# Patient Record
Sex: Female | Born: 1970 | Race: White | Hispanic: No | Smoking: Current every day smoker
Health system: Southern US, Community
[De-identification: ages and names within clinical notes are randomized; demographics above are authoritative.]

## PROBLEM LIST (undated history)

## (undated) DIAGNOSIS — F4310 Post-traumatic stress disorder, unspecified: Secondary | ICD-10-CM

## (undated) DIAGNOSIS — F419 Anxiety disorder, unspecified: Secondary | ICD-10-CM

## (undated) DIAGNOSIS — F259 Schizoaffective disorder, unspecified: Secondary | ICD-10-CM

## (undated) HISTORY — PX: ECTOPIC PREGNANCY SURGERY: SHX613

---

## 1999-06-04 ENCOUNTER — Emergency Department (HOSPITAL_COMMUNITY): Admission: EM | Admit: 1999-06-04 | Discharge: 1999-06-04 | Payer: Self-pay | Admitting: Emergency Medicine

## 2002-01-05 ENCOUNTER — Emergency Department (HOSPITAL_COMMUNITY): Admission: EM | Admit: 2002-01-05 | Discharge: 2002-01-06 | Payer: Self-pay | Admitting: Emergency Medicine

## 2002-01-05 ENCOUNTER — Encounter: Payer: Self-pay | Admitting: Emergency Medicine

## 2002-01-13 ENCOUNTER — Encounter: Payer: Self-pay | Admitting: *Deleted

## 2002-01-13 ENCOUNTER — Emergency Department (HOSPITAL_COMMUNITY): Admission: EM | Admit: 2002-01-13 | Discharge: 2002-01-13 | Payer: Self-pay | Admitting: *Deleted

## 2002-02-16 ENCOUNTER — Emergency Department (HOSPITAL_COMMUNITY): Admission: EM | Admit: 2002-02-16 | Discharge: 2002-02-16 | Payer: Self-pay | Admitting: Emergency Medicine

## 2002-02-16 ENCOUNTER — Encounter: Payer: Self-pay | Admitting: Emergency Medicine

## 2002-02-20 ENCOUNTER — Inpatient Hospital Stay (HOSPITAL_COMMUNITY): Admission: AD | Admit: 2002-02-20 | Discharge: 2002-02-20 | Payer: Self-pay | Admitting: Obstetrics and Gynecology

## 2002-02-20 ENCOUNTER — Encounter: Payer: Self-pay | Admitting: Obstetrics and Gynecology

## 2002-03-06 ENCOUNTER — Encounter: Payer: Self-pay | Admitting: Obstetrics and Gynecology

## 2002-03-06 ENCOUNTER — Inpatient Hospital Stay (HOSPITAL_COMMUNITY): Admission: RE | Admit: 2002-03-06 | Discharge: 2002-03-06 | Payer: Self-pay | Admitting: Obstetrics and Gynecology

## 2002-03-09 ENCOUNTER — Inpatient Hospital Stay (HOSPITAL_COMMUNITY): Admission: AD | Admit: 2002-03-09 | Discharge: 2002-03-09 | Payer: Self-pay | Admitting: Obstetrics and Gynecology

## 2002-04-03 ENCOUNTER — Ambulatory Visit (HOSPITAL_COMMUNITY): Admission: AD | Admit: 2002-04-03 | Discharge: 2002-04-03 | Payer: Self-pay | Admitting: Obstetrics and Gynecology

## 2002-04-03 ENCOUNTER — Encounter: Payer: Self-pay | Admitting: Obstetrics and Gynecology

## 2012-06-26 ENCOUNTER — Emergency Department (HOSPITAL_COMMUNITY): Payer: Self-pay

## 2012-06-26 ENCOUNTER — Emergency Department (HOSPITAL_COMMUNITY)
Admission: EM | Admit: 2012-06-26 | Discharge: 2012-06-26 | Disposition: A | Discharge status: Home / Self Care | Payer: Self-pay | Attending: Emergency Medicine | Admitting: Emergency Medicine

## 2012-06-26 ENCOUNTER — Encounter (HOSPITAL_COMMUNITY): Payer: Self-pay | Admitting: *Deleted

## 2012-06-26 DIAGNOSIS — R4182 Altered mental status, unspecified: Secondary | ICD-10-CM | POA: Insufficient documentation

## 2012-06-26 DIAGNOSIS — Z79899 Other long term (current) drug therapy: Secondary | ICD-10-CM | POA: Insufficient documentation

## 2012-06-26 DIAGNOSIS — R739 Hyperglycemia, unspecified: Secondary | ICD-10-CM

## 2012-06-26 DIAGNOSIS — T401X4A Poisoning by heroin, undetermined, initial encounter: Secondary | ICD-10-CM | POA: Insufficient documentation

## 2012-06-26 DIAGNOSIS — Z3202 Encounter for pregnancy test, result negative: Secondary | ICD-10-CM | POA: Insufficient documentation

## 2012-06-26 DIAGNOSIS — IMO0001 Reserved for inherently not codable concepts without codable children: Secondary | ICD-10-CM | POA: Insufficient documentation

## 2012-06-26 DIAGNOSIS — F17200 Nicotine dependence, unspecified, uncomplicated: Secondary | ICD-10-CM | POA: Insufficient documentation

## 2012-06-26 DIAGNOSIS — R7309 Other abnormal glucose: Secondary | ICD-10-CM | POA: Insufficient documentation

## 2012-06-26 LAB — CBC WITH DIFFERENTIAL/PLATELET
Blasts: 0 %
Eosinophils Absolute: 0 10*3/uL (ref 0.0–0.7)
MCH: 30.2 pg (ref 26.0–34.0)
MCHC: 32.5 g/dL (ref 30.0–36.0)
MCV: 92.8 fL (ref 78.0–100.0)
Metamyelocytes Relative: 0 %
Myelocytes: 0 %
Neutro Abs: 10.3 10*3/uL — ABNORMAL HIGH (ref 1.7–7.7)
Neutrophils Relative %: 59 % (ref 43–77)
Platelets: 397 10*3/uL (ref 150–400)
Promyelocytes Absolute: 0 %
RDW: 13.9 % (ref 11.5–15.5)
nRBC: 0 /100 WBC

## 2012-06-26 LAB — COMPREHENSIVE METABOLIC PANEL
ALT: 32 U/L (ref 0–35)
AST: 40 U/L — ABNORMAL HIGH (ref 0–37)
Alkaline Phosphatase: 56 U/L (ref 39–117)
CO2: 22 mEq/L (ref 19–32)
Calcium: 9.2 mg/dL (ref 8.4–10.5)
GFR calc non Af Amer: >90 mL/min (ref >90)
Glucose, Bld: 217 mg/dL — ABNORMAL HIGH (ref 70–99)
Potassium: 4 mEq/L (ref 3.5–5.1)
Sodium: 138 mEq/L (ref 135–145)

## 2012-06-26 LAB — GLUCOSE, CAPILLARY: Glucose-Capillary: 190 mg/dL — ABNORMAL HIGH (ref 70–99)

## 2012-06-26 LAB — ACETAMINOPHEN LEVEL: Acetaminophen (Tylenol), Serum: <15 ug/mL (ref 10–30)

## 2012-06-26 LAB — SALICYLATE LEVEL: Salicylate Lvl: <2 mg/dL — ABNORMAL LOW (ref 2.8–20.0)

## 2012-06-26 MED ORDER — SODIUM CHLORIDE 0.9 % IV BOLUS (SEPSIS)
1000.0000 mL | Freq: Once | INTRAVENOUS | Status: AC
  Administered 2012-06-26: 1000 mL via INTRAVENOUS

## 2012-06-26 MED ORDER — NALOXONE HCL 1 MG/ML IJ SOLN
2.0000 mg | Freq: Once | INTRAMUSCULAR | Status: AC
  Administered 2012-06-26: 2 mg via INTRAVENOUS

## 2012-06-26 NOTE — ED Notes (Signed)
Transported to xray 

## 2012-06-26 NOTE — ED Notes (Signed)
Pt awake and responding to verbal commands after Narcan; Nasal Trumpet removed per pt; pt states that she injected Heroin but it was white and it isn't usually white; pt denies SI or HI; states she just wanted to get high; pt denies desire to quit using heroin; pt also states that she has been using crack cocaine earlier today.

## 2012-06-26 NOTE — ED Notes (Signed)
Per friend pt went into friends house; was inside house about five mins came back to car and went completely unresponsive; unresponsive on arrival

## 2012-06-26 NOTE — ED Provider Notes (Signed)
History     CSN: [id number]  Arrival date & time 06/26/12  1947   First MD Initiated Contact with Patient 06/26/12 1955      Chief Complaint  Patient presents with  . Drug Overdose    (Consider location/radiation/quality/duration/timing/severity/associated sxs/prior treatment) HPI Comments: Dropped off in triage unresponsive with agonal respiration. Friend endorses probable drug use. No evidence of trauma.  The history is provided by a friend. The history is limited by the condition of the patient.    History reviewed. No pertinent past medical history.  History reviewed. No pertinent past surgical history.  No family history on file.  History  Substance Use Topics  . Smoking status: Current Every Day Smoker -- 1.00 packs/day    Types: Cigarettes  . Smokeless tobacco: Not on file  . Alcohol Use: Yes     Comment: socially    OB History   Grav Para Term Preterm Abortions TAB SAB Ect Mult Living                  Review of Systems  Unable to perform ROS: Acuity of condition    Allergies  Codeine and Penicillins  Home Medications   Current Outpatient Rx  Name  Route  Sig  Dispense  Refill  . Aspirin-Acetaminophen-Caffeine (GOODY HEADACHE PO)   Oral   Take 1 Package by mouth as needed (pain).           BP 136/94  Pulse 109  Temp(Src) 98.4 F (36.9 C) (Oral)  Resp 22  SpO2 100%  LMP 05/29/2012  Physical Exam  Constitutional: She appears well-developed and well-nourished. She appears distressed.  Unresponsive, shallow agonal respirations  HENT:  Head: Normocephalic and atraumatic.  Mouth/Throat: Oropharynx is clear and moist. No oropharyngeal exudate.  Eyes: Conjunctivae and EOM are normal. Pupils are equal, round, and reactive to light.  Pinpoint pupils bilaterally  Neck: Normal range of motion. Neck supple.  Cardiovascular: Regular rhythm and normal heart sounds.   No murmur heard. Tachycardic  Pulmonary/Chest: Breath sounds normal. No  respiratory distress.  Shallow agonal respirations  Abdominal: Soft. There is no tenderness. There is no rebound and no guarding.  Musculoskeletal: Normal range of motion. She exhibits no edema and no tenderness.  Neurological:  Unresponsive.  Skin: Skin is warm and dry.    ED Course  Procedures (including critical care time)  Labs Reviewed  GLUCOSE, CAPILLARY - Abnormal; Notable for the following:    Glucose-Capillary 190 (*)    All other components within normal limits  CBC WITH DIFFERENTIAL - Abnormal; Notable for the following:    WBC 17.4 (*)    Neutro Abs 10.3 (*)    Lymphs Abs 5.9 (*)    Monocytes Absolute 1.2 (*)    All other components within normal limits  COMPREHENSIVE METABOLIC PANEL - Abnormal; Notable for the following:    Glucose, Bld 217 (*)    AST 40 (*)    Total Bilirubin 0.2 (*)    All other components within normal limits  SALICYLATE LEVEL - Abnormal; Notable for the following:    Salicylate Lvl <2.0 (*)    All other components within normal limits  TROPONIN I  ETHANOL  ACETAMINOPHEN LEVEL  D-DIMER, QUANTITATIVE  PREGNANCY, URINE  URINE RAPID DRUG SCREEN (HOSP PERFORMED)  URINALYSIS, ROUTINE W REFLEX MICROSCOPIC   Dg Chest 1 View  06/26/2012  *RADIOLOGY REPORT*  Clinical Data: Overdose  CHEST - 1 VIEW  Comparison: None.  Findings: Lungs are clear. No  pleural effusion or pneumothorax.  Cardiomediastinal silhouette is within normal limits.  IMPRESSION: No evidence of acute cardiopulmonary disease.   Original Report Authenticated By: Charline Bills, M.D.    Ct Head Wo Contrast  06/26/2012  *RADIOLOGY REPORT*  Clinical Data: Altered mental status after drug use.  CT HEAD WITHOUT CONTRAST  Technique:  Contiguous axial images were obtained from the base of the skull through the vertex without contrast.  Comparison: None.  Findings: There is no evidence for acute infarction, intracranial hemorrhage, mass lesion, hydrocephalus, or extra-axial fluid. There is no  atrophy or white matter disease.  Calvarium is intact. Clear sinuses and mastoids.  IMPRESSION: Negative exam.   Original Report Authenticated By: Davonna Belling, M.D.      1. Drug overdose, initial encounter       MDM  Unresponsive, suspect heroin OD.  Assisted ventilations with bagging. CBG 190.  Pulses intact.  No evidence of trauma. Given 2 mg narcan.  Immediate improvement, patient became awake, moving all extremities, interactive. Admits to heroin injection.  No SI or HI. Did use crack earlier today.   Patient has been observed in the ED for 2 hours with no recurrence in symptoms. She is awake and alert. She has persistent tachycardia though it is improved to low 100s.  Suspect secondary to patient's crack use earlier today. EKG sinus, d-dimer negative. Denies SOB and CP.   Heroin cessation discussed.      Date: 06/26/2012  Rate: 118  Rhythm: sinus tachycardia  QRS Axis: normal  Intervals: normal  ST/T Wave abnormalities: normal  Conduction Disutrbances:none  Narrative Interpretation:   Old EKG Reviewed: none available  CRITICAL CARE Performed by: Glynn Octave   Total critical care time: 30  Critical care time was exclusive of separately billable procedures and treating other patients.  Critical care was necessary to treat or prevent imminent or life-threatening deterioration.  Critical care was time spent personally by me on the following activities: development of treatment plan with patient and/or surrogate as well as nursing, discussions with consultants, evaluation of patient's response to treatment, examination of patient, obtaining history from patient or surrogate, ordering and performing treatments and interventions, ordering and review of laboratory studies, ordering and review of radiographic studies, pulse oximetry and re-evaluation of patient's condition.   Glynn Octave, MD 06/26/12 2234

## 2013-01-30 ENCOUNTER — Emergency Department (HOSPITAL_COMMUNITY)
Admission: EM | Admit: 2013-01-30 | Discharge: 2013-01-30 | Disposition: A | Discharge status: Home / Self Care | Payer: Self-pay | Attending: Emergency Medicine | Admitting: Emergency Medicine

## 2013-01-30 ENCOUNTER — Encounter (HOSPITAL_COMMUNITY): Payer: Self-pay | Admitting: Emergency Medicine

## 2013-01-30 ENCOUNTER — Emergency Department (HOSPITAL_COMMUNITY): Payer: Self-pay

## 2013-01-30 DIAGNOSIS — Y9389 Activity, other specified: Secondary | ICD-10-CM | POA: Insufficient documentation

## 2013-01-30 DIAGNOSIS — Z791 Long term (current) use of non-steroidal anti-inflammatories (NSAID): Secondary | ICD-10-CM | POA: Insufficient documentation

## 2013-01-30 DIAGNOSIS — F17200 Nicotine dependence, unspecified, uncomplicated: Secondary | ICD-10-CM | POA: Insufficient documentation

## 2013-01-30 DIAGNOSIS — Y929 Unspecified place or not applicable: Secondary | ICD-10-CM | POA: Insufficient documentation

## 2013-01-30 DIAGNOSIS — S93401A Sprain of unspecified ligament of right ankle, initial encounter: Secondary | ICD-10-CM

## 2013-01-30 DIAGNOSIS — S93409A Sprain of unspecified ligament of unspecified ankle, initial encounter: Secondary | ICD-10-CM | POA: Insufficient documentation

## 2013-01-30 DIAGNOSIS — X500XXA Overexertion from strenuous movement or load, initial encounter: Secondary | ICD-10-CM | POA: Insufficient documentation

## 2013-01-30 MED ORDER — HYDROCODONE-ACETAMINOPHEN 5-325 MG PO TABS
1.0000 | ORAL_TABLET | Freq: Once | ORAL | Status: AC
  Administered 2013-01-30: 1 via ORAL
  Filled 2013-01-30: qty 1

## 2013-01-30 MED ORDER — IBUPROFEN 800 MG PO TABS: 800.0000 mg | ORAL_TABLET | Freq: Three times a day (TID) | ORAL | Status: DC

## 2013-01-30 MED ORDER — HYDROCODONE-ACETAMINOPHEN 5-325 MG PO TABS: 1.0000 | ORAL_TABLET | Freq: Four times a day (QID) | ORAL | Status: DC | PRN

## 2013-01-30 NOTE — ED Notes (Signed)
Ace wrap applied to rt ankle 

## 2013-01-30 NOTE — ED Notes (Signed)
Per pt sts right ankle injury a few days ago. sts stepped off the porch and twisted ankle.

## 2013-01-30 NOTE — ED Provider Notes (Signed)
CSN: [id number]     Arrival date & time 01/30/13  1719 History   None     This chart was scribed for non-physician practitioner, Francee Piccolo PA-C working with Layla Maw Ward, DO by Arlan Organ, ED Scribe. This patient was seen in room TR07C/TR07C and the patient's care was started at 6:26 PM.   Chief Complaint  Patient presents with  . Ankle Injury   The history is provided by the patient. No language interpreter was used.    HPI Comments: Chelsea Blake is a 42 y.o. female who presents to the Emergency Department complaining of gradually worsening right ankle pain that started 2 days ago. Pt states she stepped off of her porch, and twisted her right ankle. She states at the time of the incident, she heard a "pop". She rates the pain 8/10. She says elevation and staying off of her foot relieves the pain, and weight baring to the right ankle worsens the pain. She denies fever.  History reviewed. No pertinent past medical history. History reviewed. No pertinent past surgical history. History reviewed. No pertinent family history. History  Substance Use Topics  . Smoking status: Current Every Day Smoker -- 1.00 packs/day    Types: Cigarettes  . Smokeless tobacco: Not on file  . Alcohol Use: Yes     Comment: socially   OB History   Grav Para Term Preterm Abortions TAB SAB Ect Mult Living                 Review of Systems  Constitutional: Negative for fever.  Musculoskeletal: Positive for arthralgias (right ankle pain), joint swelling and myalgias.  Skin: Negative for wound.    Allergies  Review of patient's allergies indicates no known allergies.  Home Medications   Current Outpatient Rx  Name  Route  Sig  Dispense  Refill  . ibuprofen (ADVIL,MOTRIN) 200 MG tablet   Oral   Take 800 mg by mouth every 6 (six) hours as needed (pain/swelling).         Marland Kitchen HYDROcodone-acetaminophen (NORCO/VICODIN) 5-325 MG per tablet   Oral   Take 1 tablet by mouth every 6 (six)  hours as needed for severe pain.   6 tablet   0   . ibuprofen (ADVIL,MOTRIN) 800 MG tablet   Oral   Take 1 tablet (800 mg total) by mouth 3 (three) times daily.   21 tablet   0    Triage Vitals: BP 123/95  Pulse 106  Temp(Src) 98.1 F (36.7 C) (Oral)  Resp 18  SpO2 98%  LMP 01/30/2013  Physical Exam  Nursing note and vitals reviewed. Constitutional: She is oriented to person, place, and time. She appears well-developed and well-nourished.  HENT:  Head: Normocephalic and atraumatic.  Eyes: EOM are normal.  Neck: Normal range of motion.  Cardiovascular: Normal rate and intact distal pulses.   Pulmonary/Chest: Effort normal.  Musculoskeletal:       Right ankle: She exhibits decreased range of motion and swelling. She exhibits no deformity, no laceration and normal pulse. Tenderness. Lateral malleolus tenderness found.       Left ankle: Normal.       Right foot: Normal.  Neurological: She is alert and oriented to person, place, and time.  Skin: Skin is warm and dry.  Psychiatric: She has a normal mood and affect. Her behavior is normal.    ED Course  Procedures (including critical care time)  DIAGNOSTIC STUDIES: Oxygen Saturation is 98% on RA, Normal by  my interpretation.    COORDINATION OF CARE: 6:26 PM- Will order X-Ray. Will give pain medication. Discussed treatment plan with pt at bedside and pt agreed to plan.     Labs Review Labs Reviewed - No data to display Imaging Review Dg Ankle Complete Right  01/30/2013   CLINICAL DATA:  Stepped off porch and twisted right ankle 2 days ago, pain, swelling, and discoloration at lateral malleolus  EXAM: RIGHT ANKLE - COMPLETE 3+ VIEW  COMPARISON:  None  FINDINGS: Lateral soft tissue swelling.  Osseous mineralization grossly normal for technique.  No acute fracture, dislocation or bone destruction.  IMPRESSION: No acute osseous abnormalities.   Electronically Signed   By: Ulyses Southward M.D.   On: 01/30/2013 18:13    EKG  Interpretation   None       MDM   1. Right ankle sprain, initial encounter     Afebrile, NAD, non-toxic appearing, AAOx4. Neurovascularly intact. Normal sensation. Imaging shows no fracture. Directed pt to ice injury, take acetaminophen or ibuprofen for pain, and to elevate and rest the injury when possible. Wrapped ankle for support and comfort. Provided crutches. Return precautions discussed. Patient is agreeable to plan. Patient is stable at time of discharge      I personally performed the services described in this documentation, which was scribed in my presence. The recorded information has been reviewed and is accurate.    Jeannetta Ellis, PA-C 01/30/13 2145

## 2013-01-30 NOTE — ED Provider Notes (Signed)
Medical screening examination/treatment/procedure(s) were performed by non-physician practitioner and as supervising physician I was immediately available for consultation/collaboration.  EKG Interpretation   None         Delina Kruczek N Shalom Mcguiness, DO 01/30/13 2355 

## 2013-04-21 ENCOUNTER — Encounter (HOSPITAL_COMMUNITY): Payer: Self-pay | Admitting: Emergency Medicine

## 2013-04-21 ENCOUNTER — Emergency Department (HOSPITAL_COMMUNITY)
Admission: EM | Admit: 2013-04-21 | Discharge: 2013-04-21 | Disposition: A | Discharge status: Home / Self Care | Payer: Self-pay | Attending: Emergency Medicine | Admitting: Emergency Medicine

## 2013-04-21 DIAGNOSIS — T401X1A Poisoning by heroin, accidental (unintentional), initial encounter: Secondary | ICD-10-CM | POA: Insufficient documentation

## 2013-04-21 DIAGNOSIS — Z3202 Encounter for pregnancy test, result negative: Secondary | ICD-10-CM | POA: Insufficient documentation

## 2013-04-21 DIAGNOSIS — T401X4A Poisoning by heroin, undetermined, initial encounter: Secondary | ICD-10-CM | POA: Insufficient documentation

## 2013-04-21 DIAGNOSIS — Y9389 Activity, other specified: Secondary | ICD-10-CM | POA: Insufficient documentation

## 2013-04-21 DIAGNOSIS — Z791 Long term (current) use of non-steroidal anti-inflammatories (NSAID): Secondary | ICD-10-CM | POA: Insufficient documentation

## 2013-04-21 DIAGNOSIS — F17200 Nicotine dependence, unspecified, uncomplicated: Secondary | ICD-10-CM | POA: Insufficient documentation

## 2013-04-21 DIAGNOSIS — R000 Tachycardia, unspecified: Secondary | ICD-10-CM | POA: Insufficient documentation

## 2013-04-21 DIAGNOSIS — Y929 Unspecified place or not applicable: Secondary | ICD-10-CM | POA: Insufficient documentation

## 2013-04-21 LAB — URINE MICROSCOPIC-ADD ON

## 2013-04-21 LAB — COMPREHENSIVE METABOLIC PANEL
ALBUMIN: 3.6 g/dL (ref 3.5–5.2)
ALT: 32 U/L (ref 0–35)
AST: 23 U/L (ref 0–37)
Alkaline Phosphatase: 84 U/L (ref 39–117)
BUN: 13 mg/dL (ref 6–23)
CALCIUM: 8.8 mg/dL (ref 8.4–10.5)
CHLORIDE: 98 meq/L (ref 96–112)
CO2: 24 mEq/L (ref 19–32)
CREATININE: 0.74 mg/dL (ref 0.50–1.10)
GFR calc Af Amer: >90 mL/min (ref >90)
GFR calc non Af Amer: >90 mL/min (ref >90)
Glucose, Bld: 108 mg/dL — ABNORMAL HIGH (ref 70–99)
Potassium: 3.6 mEq/L — ABNORMAL LOW (ref 3.7–5.3)
Sodium: 136 mEq/L — ABNORMAL LOW (ref 137–147)
TOTAL PROTEIN: 7.4 g/dL (ref 6.0–8.3)
Total Bilirubin: 0.3 mg/dL (ref 0.3–1.2)

## 2013-04-21 LAB — URINALYSIS, ROUTINE W REFLEX MICROSCOPIC
BILIRUBIN URINE: NEGATIVE
GLUCOSE, UA: 250 mg/dL — AB
HGB URINE DIPSTICK: NEGATIVE
Ketones, ur: NEGATIVE mg/dL
Leukocytes, UA: NEGATIVE
Nitrite: NEGATIVE
PROTEIN: 30 mg/dL — AB
Specific Gravity, Urine: 1.024 (ref 1.005–1.030)
UROBILINOGEN UA: 1 mg/dL (ref 0.0–1.0)
pH: 5 (ref 5.0–8.0)

## 2013-04-21 LAB — CBC WITH DIFFERENTIAL/PLATELET
BASOS ABS: 0 10*3/uL (ref 0.0–0.1)
BASOS PCT: 0 % (ref 0–1)
EOS ABS: 0.1 10*3/uL (ref 0.0–0.7)
EOS PCT: 1 % (ref 0–5)
HEMATOCRIT: 39.7 % (ref 36.0–46.0)
Hemoglobin: 13 g/dL (ref 12.0–15.0)
Lymphocytes Relative: 7 % — ABNORMAL LOW (ref 12–46)
Lymphs Abs: 0.9 10*3/uL (ref 0.7–4.0)
MCH: 30.2 pg (ref 26.0–34.0)
MCHC: 32.7 g/dL (ref 30.0–36.0)
MCV: 92.1 fL (ref 78.0–100.0)
MONO ABS: 0.9 10*3/uL (ref 0.1–1.0)
Monocytes Relative: 7 % (ref 3–12)
NEUTROS ABS: 11.2 10*3/uL — AB (ref 1.7–7.7)
Neutrophils Relative %: 86 % — ABNORMAL HIGH (ref 43–77)
Platelets: 305 10*3/uL (ref 150–400)
RBC: 4.31 MIL/uL (ref 3.87–5.11)
RDW: 13.1 % (ref 11.5–15.5)
WBC: 13.1 10*3/uL — ABNORMAL HIGH (ref 4.0–10.5)

## 2013-04-21 LAB — ETHANOL

## 2013-04-21 LAB — PREGNANCY, URINE: PREG TEST UR: NEGATIVE

## 2013-04-21 LAB — RAPID URINE DRUG SCREEN, HOSP PERFORMED
AMPHETAMINES: NOT DETECTED
BENZODIAZEPINES: POSITIVE — AB
Barbiturates: NOT DETECTED
COCAINE: POSITIVE — AB
OPIATES: POSITIVE — AB
TETRAHYDROCANNABINOL: POSITIVE — AB

## 2013-04-21 MED ORDER — SODIUM CHLORIDE 0.9 % IV SOLN
INTRAVENOUS | Status: DC
  Administered 2013-04-21: 13:00:00 via INTRAVENOUS

## 2013-04-21 MED ORDER — ONDANSETRON HCL 4 MG/2ML IJ SOLN
4.0000 mg | Freq: Once | INTRAMUSCULAR | Status: AC
  Administered 2013-04-21: 4 mg via INTRAVENOUS
  Filled 2013-04-21: qty 2

## 2013-04-21 NOTE — ED Notes (Signed)
MD aware of AMA. Pt told precautions to return

## 2013-04-21 NOTE — ED Notes (Signed)
Per EMS. Pt was with friend using IV heroin, friend states pt became unresponsive. Fire dept arrived and gave 2mg  narcan. Pt alert and oriented on EMS arrival. Fire dept had unsuccessful attempt to place nasal airway and pt has blood on face.

## 2013-04-21 NOTE — Discharge Instructions (Signed)
Narcotic Overdose  A narcotic overdose is the misuse or overuse of a narcotic drug. A narcotic overdose can make you pass out and stop breathing. If you are not treated right away, this can cause permanent brain damage or stop your heart. Medicine may be given to reverse the effects of an overdose. If so, this medicine may bring on withdrawal symptoms. The symptoms may be abdominal cramps, throwing up (vomiting), sweating, chills, and nervousness.  Injecting narcotics can cause more problems than just an overdose. AIDS, hepatitis, and other very serious infections are transmitted by sharing needles and syringes. If you decide to quit using, there are medicines which can help you through the withdrawal period. Trying to quit all at once on your own can be uncomfortable, but not life-threatening. Call your caregiver, Narcotics Anonymous, or any drug and alcohol treatment program for further help.   Document Released: 03/22/2004 Document Revised: 05/07/2011 Document Reviewed: 01/14/2009  [employer] Patient Information 2014 [employer]

## 2013-04-21 NOTE — ED Notes (Signed)
Bed: [id number] Expected date:  Expected time:  Means of arrival:  Comments: ems heroin

## 2013-04-21 NOTE — ED Notes (Signed)
Pt wants to leave ama, Pt ambulatory. Pt states family is waiting room

## 2013-04-21 NOTE — ED Provider Notes (Signed)
CSN: [id number]     Arrival date & time 04/21/13  1303 History   First MD Initiated Contact with Patient 04/21/13 1310     Chief Complaint  Patient presents with  . Altered Mental Status  . Drug Overdose   Level V caveat due to uncooperativeness and mental status changes.  (Consider location/radiation/quality/duration/timing/severity/associated sxs/prior Treatment) Patient is a 43 y.o. female presenting with altered mental status and Overdose. The history is provided by the patient.  Altered Mental Status Drug Overdose   patient reportedly had been doing IV heroin with a friend. She became unresponsive. She was given 2 mg of IV Narcan with some improvement. Also reportedly had a nasal trumpet attempt to cause some bleeding. Patient will not tell me what happened. She does, she doesn't want a nasal cannula. She will not talk to me about drug use or how she is feeling now.  History reviewed. No pertinent past medical history. History reviewed. No pertinent past surgical history. History reviewed. No pertinent family history. History  Substance Use Topics  . Smoking status: Current Every Day Smoker -- 1.00 packs/day    Types: Cigarettes  . Smokeless tobacco: Not on file  . Alcohol Use: Yes     Comment: socially   OB History   Grav Para Term Preterm Abortions TAB SAB Ect Mult Living                 Review of Systems  Unable to perform ROS     Allergies  Review of patient's allergies indicates no known allergies.  Home Medications   Current Outpatient Rx  Name  Route  Sig  Dispense  Refill  . HYDROcodone-acetaminophen (NORCO/VICODIN) 5-325 MG per tablet   Oral   Take 1 tablet by mouth every 6 (six) hours as needed for severe pain.   6 tablet   0   . ibuprofen (ADVIL,MOTRIN) 200 MG tablet   Oral   Take 800 mg by mouth every 6 (six) hours as needed (pain/swelling).         Marland Kitchen. ibuprofen (ADVIL,MOTRIN) 800 MG tablet   Oral   Take 1 tablet (800 mg total) by mouth 3  (three) times daily.   21 tablet   0    BP 139/98  Pulse 122  Temp(Src) 97.7 F (36.5 C)  Resp 20  SpO2 89% Physical Exam  Vitals reviewed. Constitutional: She appears well-developed.  HENT:  Head: Normocephalic.  Dry blood and bilateral nares  Eyes:  Pupils are somewhat constricted.  Cardiovascular: Regular rhythm.   Mild tachycardia   Abdominal: Soft. There is no tenderness.  Musculoskeletal: She exhibits no edema.  Neurological: She is alert.  Skin: Skin is warm.    ED Course  Procedures (including critical care time) Labs Review Labs Reviewed  CBC WITH DIFFERENTIAL - Abnormal; Notable for the following:    WBC 13.1 (*)    Neutrophils Relative % 86 (*)    Neutro Abs 11.2 (*)    Lymphocytes Relative 7 (*)    All other components within normal limits  COMPREHENSIVE METABOLIC PANEL - Abnormal; Notable for the following:    Sodium 136 (*)    Potassium 3.6 (*)    Glucose, Bld 108 (*)    All other components within normal limits  URINE RAPID DRUG SCREEN (HOSP PERFORMED) - Abnormal; Notable for the following:    Opiates POSITIVE (*)    Cocaine POSITIVE (*)    Benzodiazepines POSITIVE (*)    Tetrahydrocannabinol POSITIVE (*)  All other components within normal limits  URINALYSIS, ROUTINE W REFLEX MICROSCOPIC - Abnormal; Notable for the following:    APPearance CLOUDY (*)    Glucose, UA 250 (*)    Protein, ur 30 (*)    All other components within normal limits  URINE MICROSCOPIC-ADD ON - Abnormal; Notable for the following:    Squamous Epithelial / LPF MANY (*)    All other components within normal limits  ETHANOL  PREGNANCY, URINE   Imaging Review No results found.  EKG Interpretation   None       MDM   Final diagnoses:  Accidental heroin overdose    Patient presents after a heroin overdose. Discussed with patient states she has not used since last time she overdosed to person she gave her too much. She is not willing to stay for further  treatment. This point she is awake and appropriate and appears able to make the decisions. I doubt a severe life-threatening overdose at this time. She is likely metabolized in the heroin but will not return to the respiratory depression. Patient left ED    Juliet Rude. Rubin Payor, MD 04/22/13 0700

## 2015-02-23 ENCOUNTER — Emergency Department (HOSPITAL_COMMUNITY)
Admission: EM | Admit: 2015-02-23 | Discharge: 2015-02-23 | Disposition: A | Discharge status: Home / Self Care | Payer: Self-pay | Attending: Emergency Medicine | Admitting: Emergency Medicine

## 2015-02-23 ENCOUNTER — Encounter (HOSPITAL_COMMUNITY): Payer: Self-pay | Admitting: *Deleted

## 2015-02-23 DIAGNOSIS — R000 Tachycardia, unspecified: Secondary | ICD-10-CM | POA: Insufficient documentation

## 2015-02-23 DIAGNOSIS — N12 Tubulo-interstitial nephritis, not specified as acute or chronic: Secondary | ICD-10-CM | POA: Insufficient documentation

## 2015-02-23 DIAGNOSIS — Z3202 Encounter for pregnancy test, result negative: Secondary | ICD-10-CM | POA: Insufficient documentation

## 2015-02-23 DIAGNOSIS — F17210 Nicotine dependence, cigarettes, uncomplicated: Secondary | ICD-10-CM | POA: Insufficient documentation

## 2015-02-23 LAB — URINALYSIS, ROUTINE W REFLEX MICROSCOPIC
Bilirubin Urine: NEGATIVE
Glucose, UA: NEGATIVE mg/dL
Ketones, ur: NEGATIVE mg/dL
Nitrite: POSITIVE — AB
Protein, ur: 30 mg/dL — AB
Specific Gravity, Urine: 1.017 (ref 1.005–1.030)
pH: 5.5 (ref 5.0–8.0)

## 2015-02-23 LAB — CBC WITH DIFFERENTIAL/PLATELET
Basophils Absolute: 0 10*3/uL (ref 0.0–0.1)
Basophils Relative: 0 %
Eosinophils Absolute: 0.1 10*3/uL (ref 0.0–0.7)
Eosinophils Relative: 0 %
HCT: 39.8 % (ref 36.0–46.0)
Hemoglobin: 13.5 g/dL (ref 12.0–15.0)
Lymphocytes Relative: 5 %
Lymphs Abs: 1.1 10*3/uL (ref 0.7–4.0)
MCH: 29.3 pg (ref 26.0–34.0)
MCHC: 33.9 g/dL (ref 30.0–36.0)
MCV: 86.3 fL (ref 78.0–100.0)
Monocytes Absolute: 2.2 10*3/uL — ABNORMAL HIGH (ref 0.1–1.0)
Monocytes Relative: 9 %
Neutro Abs: 20 10*3/uL — ABNORMAL HIGH (ref 1.7–7.7)
Neutrophils Relative %: 86 %
Platelets: 498 10*3/uL — ABNORMAL HIGH (ref 150–400)
RBC: 4.61 MIL/uL (ref 3.87–5.11)
RDW: 13.9 % (ref 11.5–15.5)
WBC: 23.3 10*3/uL — ABNORMAL HIGH (ref 4.0–10.5)

## 2015-02-23 LAB — URINE MICROSCOPIC-ADD ON

## 2015-02-23 LAB — POC URINE PREG, ED: Preg Test, Ur: NEGATIVE

## 2015-02-23 MED ORDER — DEXTROSE 5 % IV SOLN
1.0000 g | Freq: Once | INTRAVENOUS | Status: AC
  Administered 2015-02-23: 1 g via INTRAVENOUS
  Filled 2015-02-23: qty 10

## 2015-02-23 MED ORDER — HYDROMORPHONE HCL 1 MG/ML IJ SOLN
1.0000 mg | Freq: Once | INTRAMUSCULAR | Status: AC
  Administered 2015-02-23: 1 mg via INTRAVENOUS
  Filled 2015-02-23: qty 1

## 2015-02-23 MED ORDER — KETOROLAC TROMETHAMINE 15 MG/ML IJ SOLN
15.0000 mg | Freq: Once | INTRAMUSCULAR | Status: AC
  Administered 2015-02-23: 15 mg via INTRAVENOUS
  Filled 2015-02-23: qty 1

## 2015-02-23 MED ORDER — ONDANSETRON HCL 4 MG/2ML IJ SOLN
4.0000 mg | Freq: Once | INTRAMUSCULAR | Status: AC
  Administered 2015-02-23: 4 mg via INTRAVENOUS
  Filled 2015-02-23: qty 2

## 2015-02-23 MED ORDER — SODIUM CHLORIDE 0.9 % IV BOLUS (SEPSIS)
1000.0000 mL | Freq: Once | INTRAVENOUS | Status: AC
  Administered 2015-02-23: 1000 mL via INTRAVENOUS

## 2015-02-23 MED ORDER — OXYCODONE-ACETAMINOPHEN 5-325 MG PO TABS: 1.0000 | ORAL_TABLET | ORAL | Status: DC | PRN

## 2015-02-23 MED ORDER — SULFAMETHOXAZOLE-TRIMETHOPRIM 800-160 MG PO TABS: 1.0000 | ORAL_TABLET | Freq: Two times a day (BID) | ORAL | Status: AC

## 2015-02-23 NOTE — ED Notes (Signed)
C/o right side pain onset last Thurs. States she called ems however wasn't transported. Today pain is worse.

## 2015-02-23 NOTE — Discharge Instructions (Signed)

## 2015-03-09 NOTE — ED Provider Notes (Signed)
CSN: [id number]     Arrival date & time 02/23/15  1148 History   First MD Initiated Contact with Patient 02/23/15 1352     Chief Complaint  Patient presents with  . Flank Pain     (Consider location/radiation/quality/duration/timing/severity/associated sxs/prior Treatment) HPI   45 year old female with right-sided abdominal/flank pain. Onset Thursday. Progressively worsening since then. Pain is constant. Worse with certain movements. No fevers or chills. Nausea, but no vomiting. Decreased urinary frequency and some mild dysuria. No hematuria. No unusual vaginal bleeding or discharge.  History reviewed. No pertinent past medical history. History reviewed. No pertinent past surgical history. History reviewed. No pertinent family history. Social History  Substance Use Topics  . Smoking status: Current Every Day Smoker -- 1.00 packs/day    Types: Cigarettes  . Smokeless tobacco: None  . Alcohol Use: Yes     Comment: socially   OB History    No data available     Review of Systems  All systems reviewed and negative, other than as noted in HPI.   Allergies  Review of patient's allergies indicates no known allergies.  Home Medications   Prior to Admission medications   Medication Sig Start Date End Date Taking? Authorizing Provider  doxylamine, Sleep, (UNISOM) 25 MG tablet Take 25 mg by mouth every 6 (six) hours as needed (sleep/pain).   Yes Historical Provider, MD  ibuprofen (ADVIL,MOTRIN) 200 MG tablet Take 800 mg by mouth every 4 (four) hours as needed (pain/swelling).    Yes Historical Provider, MD  HYDROcodone-acetaminophen (NORCO/VICODIN) 5-325 MG per tablet Take 1 tablet by mouth every 6 (six) hours as needed for severe pain. 01/30/13   Jennifer Piepenbrink, PA-C  oxyCODONE-acetaminophen (PERCOCET/ROXICET) 5-325 MG tablet Take 1 tablet by mouth every 4 (four) hours as needed for severe pain. 02/23/15   Raeford Razor, MD   BP 134/94 mmHg  Pulse 97  Temp(Src) 98.4 F  (36.9 C) (Oral)  Resp 19  Ht 5\' 2"  (1.575 m)  Wt 130 lb (58.968 kg)  BMI 23.77 kg/m2  SpO2 97%  LMP 02/23/2015 Physical Exam  Constitutional: She appears well-developed and well-nourished. No distress.  HENT:  Head: Normocephalic and atraumatic.  Eyes: Conjunctivae are normal. Right eye exhibits no discharge. Left eye exhibits no discharge.  Neck: Neck supple.  Cardiovascular: Normal rate, regular rhythm and normal heart sounds.  Exam reveals no gallop and no friction rub.   No murmur heard. Pulmonary/Chest: Effort normal and breath sounds normal. No respiratory distress.  Abdominal: Soft. She exhibits no distension. There is no tenderness.  Genitourinary:  Right CVA tenderness  Musculoskeletal: She exhibits no edema or tenderness.  Neurological: She is alert.  Skin: Skin is warm and dry.  Psychiatric: She has a normal mood and affect. Her behavior is normal. Thought content normal.  Nursing note and vitals reviewed.   ED Course  Procedures (including critical care time) Labs Review Labs Reviewed  CBC WITH DIFFERENTIAL/PLATELET - Abnormal; Notable for the following:    WBC 23.3 (*)    Platelets 498 (*)    Neutro Abs 20.0 (*)    Monocytes Absolute 2.2 (*)    All other components within normal limits  URINALYSIS, ROUTINE W REFLEX MICROSCOPIC (NOT AT [facility]) - Abnormal; Notable for the following:    APPearance CLOUDY (*)    Hgb urine dipstick TRACE (*)    Protein, ur 30 (*)    Nitrite POSITIVE (*)    Leukocytes, UA MODERATE (*)    All other components within  normal limits  URINE MICROSCOPIC-ADD ON - Abnormal; Notable for the following:    Squamous Epithelial / LPF 0-5 (*)    Bacteria, UA MANY (*)    Casts WBC CAST (*)    All other components within normal limits  POC URINE PREG, ED    Imaging Review No results found. I have personally reviewed and evaluated these images and lab results as part of my medical decision-making.   EKG Interpretation None      MDM    Final diagnoses:  Pyelonephritis    45 year old female with what is clinically pyelonephritis. Urinalysis is consistent with a UTI. She is afebrile. Mildly tachycardic, otherwise hemodynamically stable. Given a dose of IV Rocephin. He is appropriate for further outpatient treatment. Return precautions were discussed.    Raeford RazorStephen Mellie Buccellato, MD 03/09/15 [phone]

## 2015-03-22 ENCOUNTER — Emergency Department (HOSPITAL_COMMUNITY): Payer: Self-pay

## 2015-03-22 ENCOUNTER — Inpatient Hospital Stay (HOSPITAL_COMMUNITY)
Admission: EM | Admit: 2015-03-22 | Discharge: 2015-03-26 | DRG: 872 | Disposition: A | Discharge status: Home / Self Care | Payer: Self-pay | Attending: Internal Medicine | Admitting: Internal Medicine

## 2015-03-22 ENCOUNTER — Encounter (HOSPITAL_COMMUNITY): Payer: Self-pay

## 2015-03-22 DIAGNOSIS — R634 Abnormal weight loss: Secondary | ICD-10-CM | POA: Diagnosis present

## 2015-03-22 DIAGNOSIS — R079 Chest pain, unspecified: Secondary | ICD-10-CM | POA: Diagnosis present

## 2015-03-22 DIAGNOSIS — F1991 Other psychoactive substance use, unspecified, in remission: Secondary | ICD-10-CM | POA: Diagnosis present

## 2015-03-22 DIAGNOSIS — R05 Cough: Secondary | ICD-10-CM | POA: Diagnosis present

## 2015-03-22 DIAGNOSIS — R630 Anorexia: Secondary | ICD-10-CM | POA: Diagnosis present

## 2015-03-22 DIAGNOSIS — N12 Tubulo-interstitial nephritis, not specified as acute or chronic: Secondary | ICD-10-CM | POA: Diagnosis present

## 2015-03-22 DIAGNOSIS — N63 Unspecified lump in unspecified breast: Secondary | ICD-10-CM | POA: Diagnosis present

## 2015-03-22 DIAGNOSIS — F17210 Nicotine dependence, cigarettes, uncomplicated: Secondary | ICD-10-CM | POA: Diagnosis present

## 2015-03-22 DIAGNOSIS — R0789 Other chest pain: Secondary | ICD-10-CM | POA: Diagnosis present

## 2015-03-22 DIAGNOSIS — R109 Unspecified abdominal pain: Secondary | ICD-10-CM

## 2015-03-22 DIAGNOSIS — J90 Pleural effusion, not elsewhere classified: Secondary | ICD-10-CM | POA: Diagnosis present

## 2015-03-22 DIAGNOSIS — R918 Other nonspecific abnormal finding of lung field: Secondary | ICD-10-CM | POA: Diagnosis present

## 2015-03-22 DIAGNOSIS — R509 Fever, unspecified: Secondary | ICD-10-CM

## 2015-03-22 DIAGNOSIS — Z590 Homelessness: Secondary | ICD-10-CM

## 2015-03-22 DIAGNOSIS — R112 Nausea with vomiting, unspecified: Secondary | ICD-10-CM | POA: Diagnosis present

## 2015-03-22 DIAGNOSIS — Z87898 Personal history of other specified conditions: Secondary | ICD-10-CM | POA: Diagnosis present

## 2015-03-22 DIAGNOSIS — A419 Sepsis, unspecified organism: Principal | ICD-10-CM | POA: Diagnosis present

## 2015-03-22 DIAGNOSIS — M545 Low back pain: Secondary | ICD-10-CM | POA: Diagnosis present

## 2015-03-22 DIAGNOSIS — E876 Hypokalemia: Secondary | ICD-10-CM | POA: Diagnosis present

## 2015-03-22 LAB — URINE MICROSCOPIC-ADD ON

## 2015-03-22 LAB — COMPREHENSIVE METABOLIC PANEL
ALT: 10 U/L — ABNORMAL LOW (ref 14–54)
AST: 13 U/L — ABNORMAL LOW (ref 15–41)
Albumin: 2.5 g/dL — ABNORMAL LOW (ref 3.5–5.0)
Alkaline Phosphatase: 100 U/L (ref 38–126)
Anion gap: 10 (ref 5–15)
BUN: 6 mg/dL (ref 6–20)
CO2: 29 mmol/L (ref 22–32)
Calcium: 8.6 mg/dL — ABNORMAL LOW (ref 8.9–10.3)
Chloride: 98 mmol/L — ABNORMAL LOW (ref 101–111)
Creatinine, Ser: 0.81 mg/dL (ref 0.44–1.00)
GFR calc Af Amer: >60 mL/min (ref >60)
GFR calc non Af Amer: >60 mL/min (ref >60)
Glucose, Bld: 126 mg/dL — ABNORMAL HIGH (ref 65–99)
Potassium: 2.7 mmol/L — CL (ref 3.5–5.1)
Sodium: 137 mmol/L (ref 135–145)
Total Bilirubin: 0.8 mg/dL (ref 0.3–1.2)
Total Protein: 7.3 g/dL (ref 6.5–8.1)

## 2015-03-22 LAB — I-STAT CG4 LACTIC ACID, ED
Lactic Acid, Venous: 2.87 mmol/L (ref 0.5–2.0)
Lactic Acid, Venous: 0.72 mmol/L (ref 0.5–2.0)

## 2015-03-22 LAB — LIPASE, BLOOD: Lipase: 19 U/L (ref 11–51)

## 2015-03-22 LAB — I-STAT BETA HCG BLOOD, ED (MC, WL, AP ONLY): I-stat hCG, quantitative: <5 m[IU]/mL (ref <5)

## 2015-03-22 LAB — CBC
HCT: 32 % — ABNORMAL LOW (ref 36.0–46.0)
Hemoglobin: 10.6 g/dL — ABNORMAL LOW (ref 12.0–15.0)
MCH: 28 pg (ref 26.0–34.0)
MCHC: 33.1 g/dL (ref 30.0–36.0)
MCV: 84.4 fL (ref 78.0–100.0)
Platelets: 445 10*3/uL — ABNORMAL HIGH (ref 150–400)
RBC: 3.79 MIL/uL — ABNORMAL LOW (ref 3.87–5.11)
RDW: 15.1 % (ref 11.5–15.5)
WBC: 31.3 10*3/uL — ABNORMAL HIGH (ref 4.0–10.5)

## 2015-03-22 LAB — URINALYSIS, ROUTINE W REFLEX MICROSCOPIC
Glucose, UA: NEGATIVE mg/dL
Ketones, ur: 15 mg/dL — AB
Nitrite: POSITIVE — AB
Protein, ur: 100 mg/dL — AB
Specific Gravity, Urine: 1.021 (ref 1.005–1.030)
pH: 6 (ref 5.0–8.0)

## 2015-03-22 LAB — I-STAT TROPONIN, ED: Troponin i, poc: 0 ng/mL (ref 0.00–0.08)

## 2015-03-22 LAB — MAGNESIUM: Magnesium: 1.9 mg/dL (ref 1.7–2.4)

## 2015-03-22 MED ORDER — SODIUM CHLORIDE 0.9 % IV BOLUS (SEPSIS)
1000.0000 mL | INTRAVENOUS | Status: AC
  Administered 2015-03-22 – 2015-03-23 (×2): 1000 mL via INTRAVENOUS

## 2015-03-22 MED ORDER — HYDROMORPHONE HCL 1 MG/ML IJ SOLN
1.0000 mg | Freq: Once | INTRAMUSCULAR | Status: AC
  Administered 2015-03-22: 1 mg via INTRAVENOUS
  Filled 2015-03-22: qty 1

## 2015-03-22 MED ORDER — HYDROCODONE-ACETAMINOPHEN 5-325 MG PO TABS: 1.0000 | ORAL_TABLET | Freq: Once | ORAL | Status: DC

## 2015-03-22 MED ORDER — NICOTINE 21 MG/24HR TD PT24: 21.0000 mg | MEDICATED_PATCH | Freq: Every day | TRANSDERMAL | Status: DC

## 2015-03-22 MED ORDER — SODIUM CHLORIDE 0.9 % IV BOLUS (SEPSIS)
1000.0000 mL | Freq: Once | INTRAVENOUS | Status: AC
  Administered 2015-03-22: 1000 mL via INTRAVENOUS

## 2015-03-22 MED ORDER — ONDANSETRON HCL 4 MG PO TABS
4.0000 mg | ORAL_TABLET | Freq: Four times a day (QID) | ORAL | Status: DC | PRN
  Administered 2015-03-23: 4 mg via ORAL
  Filled 2015-03-22: qty 1

## 2015-03-22 MED ORDER — ONDANSETRON 4 MG PO TBDP
ORAL_TABLET | ORAL | Status: AC
  Filled 2015-03-22: qty 1

## 2015-03-22 MED ORDER — ACETAMINOPHEN 325 MG PO TABS
650.0000 mg | ORAL_TABLET | Freq: Once | ORAL | Status: AC
  Administered 2015-03-22: 650 mg via ORAL
  Filled 2015-03-22: qty 2

## 2015-03-22 MED ORDER — ACETAMINOPHEN 650 MG RE SUPP: 650.0000 mg | Freq: Four times a day (QID) | RECTAL | Status: DC | PRN

## 2015-03-22 MED ORDER — ONDANSETRON HCL 4 MG/2ML IJ SOLN
4.0000 mg | Freq: Four times a day (QID) | INTRAMUSCULAR | Status: DC | PRN
  Administered 2015-03-23 – 2015-03-26 (×5): 4 mg via INTRAVENOUS
  Filled 2015-03-22 (×5): qty 2

## 2015-03-22 MED ORDER — GUAIFENESIN ER 600 MG PO TB12
600.0000 mg | ORAL_TABLET | Freq: Two times a day (BID) | ORAL | Status: DC
  Administered 2015-03-23 – 2015-03-26 (×8): 600 mg via ORAL
  Filled 2015-03-22 (×8): qty 1

## 2015-03-22 MED ORDER — SODIUM CHLORIDE 0.9% FLUSH
3.0000 mL | Freq: Two times a day (BID) | INTRAVENOUS | Status: DC
  Administered 2015-03-23 – 2015-03-25 (×5): 3 mL via INTRAVENOUS
  Administered 2015-03-25: 10 mL via INTRAVENOUS
  Administered 2015-03-26: 3 mL via INTRAVENOUS

## 2015-03-22 MED ORDER — ACETAMINOPHEN 325 MG PO TABS
650.0000 mg | ORAL_TABLET | Freq: Four times a day (QID) | ORAL | Status: DC | PRN
  Administered 2015-03-23 (×2): 650 mg via ORAL
  Filled 2015-03-22 (×2): qty 2

## 2015-03-22 MED ORDER — DEXTROSE 5 % IV SOLN
1.0000 g | Freq: Once | INTRAVENOUS | Status: AC
  Administered 2015-03-22: 1 g via INTRAVENOUS
  Filled 2015-03-22: qty 10

## 2015-03-22 MED ORDER — FENTANYL CITRATE (PF) 100 MCG/2ML IJ SOLN
25.0000 ug | Freq: Once | INTRAMUSCULAR | Status: AC
Start: 2015-03-22 — End: 2015-03-22
  Administered 2015-03-22: 25 ug via INTRAVENOUS
  Filled 2015-03-22: qty 2

## 2015-03-22 MED ORDER — POTASSIUM CHLORIDE 10 MEQ/100ML IV SOLN
10.0000 meq | INTRAVENOUS | Status: AC
  Administered 2015-03-22 (×3): 10 meq via INTRAVENOUS
  Filled 2015-03-22 (×4): qty 100

## 2015-03-22 MED ORDER — POTASSIUM CHLORIDE CRYS ER 20 MEQ PO TBCR
30.0000 meq | EXTENDED_RELEASE_TABLET | Freq: Once | ORAL | Status: AC
  Administered 2015-03-22: 30 meq via ORAL
  Filled 2015-03-22: qty 2

## 2015-03-22 MED ORDER — PIPERACILLIN-TAZOBACTAM 3.375 G IVPB
3.3750 g | Freq: Three times a day (TID) | INTRAVENOUS | Status: DC
  Administered 2015-03-23 (×2): 3.375 g via INTRAVENOUS
  Filled 2015-03-22 (×4): qty 50

## 2015-03-22 MED ORDER — KETOROLAC TROMETHAMINE 15 MG/ML IJ SOLN
15.0000 mg | Freq: Once | INTRAMUSCULAR | Status: AC
  Administered 2015-03-22: 15 mg via INTRAVENOUS
  Filled 2015-03-22: qty 1

## 2015-03-22 MED ORDER — ONDANSETRON 4 MG PO TBDP
4.0000 mg | ORAL_TABLET | Freq: Once | ORAL | Status: AC | PRN
  Administered 2015-03-22: 4 mg via ORAL

## 2015-03-22 MED ORDER — IOHEXOL 350 MG/ML SOLN
100.0000 mL | Freq: Once | INTRAVENOUS | Status: AC | PRN
  Administered 2015-03-22: 100 mL via INTRAVENOUS

## 2015-03-22 MED ORDER — ALBUTEROL SULFATE (2.5 MG/3ML) 0.083% IN NEBU: 2.5000 mg | INHALATION_SOLUTION | RESPIRATORY_TRACT | Status: DC | PRN

## 2015-03-22 NOTE — H&P (Addendum)
PCP: none   Referring provider Proulx   Chief Complaint:   Flank pain and fever  HPI: Chelsea Blake is a 45 y.o. female   has no past medical history on file.   Presented with  A shunt reports that 3 weeks ago he was diagnosed with pyelonephritis was treated with Bactrim unfortunately  urine cultures not available and since then has been having flank pain worse in the right side of her ribs she's been also having some lower abdominal cramping as well. Reports for 3 days days she has been having some coughing as well as fever. She denies any dysuria. Patient called EMS presented to emergency department. She reports weight loss about 20 lb, decreased appetite, she reports some nausea and vomiting. Reports hx of IV drug used reports last time was in 2015.  She has been having low grade fever. She reports never had a Mammogram family hx of Breast cancer. She reports feeling a knot in her right breast.   In ER: Renal protocol CT showed bilateral perinephric inflammation worrisome for pyelonephritis no evidence of hydronephrosis as well as pleural-based thick wall mass involving pleura. Meeting sepsis criteria with heart rate up to 115 respirations up to 23 white blood cell, 31.3 lactic acid 2.87. CT chest showed no PE small right pleural effusion partially loculated in paravertebral space.   Hospitalist was called for admission for Sepsis and Pyelonephritis Possible lung mass.   Review of Systems:    Pertinent positives include: Fevers, chills, shortness of breath at rest. dyspnea on exertion, weight loss   Constitutional:  No weight loss, night sweats,  fatigue,  HEENT:  No headaches, Difficulty swallowing,Tooth/dental problems,Sore throat,  No sneezing, itching, ear ache, nasal congestion, post nasal drip,  Cardio-vascular:  No chest pain, Orthopnea, PND, anasarca, dizziness, palpitations.no Bilateral lower extremity swelling  GI:  No heartburn, indigestion, abdominal pain,  nausea, vomiting, diarrhea, change in bowel habits, loss of appetite, melena, blood in stool, hematemesis Resp:     No excess mucus, no productive cough, No non-productive cough, No coughing up of blood.No change in color of mucus.No wheezing. Skin:  no rash or lesions. No jaundice GU:  no dysuria, change in color of urine, no urgency or frequency. No straining to urinate.  No flank pain.  Musculoskeletal:  No joint pain or no joint swelling. No decreased range of motion. No back pain.  Psych:  No change in mood or affect. No depression or anxiety. No memory loss.  Neuro: no localizing neurological complaints, no tingling, no weakness, no double vision, no gait abnormality, no slurred speech, no confusion  Otherwise ROS are negative except for above, 10 systems were reviewed  Past Medical History: History reviewed. No pertinent past medical history. History reviewed. No pertinent past surgical history.   Medications: Prior to Admission medications   Medication Sig Start Date End Date Taking? Authorizing Provider  ibuprofen (ADVIL,MOTRIN) 200 MG tablet Take 800 mg by mouth every 4 (four) hours as needed (pain/swelling).    Yes Historical Provider, MD  HYDROcodone-acetaminophen (NORCO/VICODIN) 5-325 MG per tablet Take 1 tablet by mouth every 6 (six) hours as needed for severe pain. Patient not taking: Reported on 03/22/2015 01/30/13   Chelsea Piccolo, PA-C  oxyCODONE-acetaminophen (PERCOCET/ROXICET) 5-325 MG tablet Take 1 tablet by mouth every 4 (four) hours as needed for severe pain. Patient not taking: Reported on 03/22/2015 02/23/15   Raeford Razor, MD    Allergies:  No Known Allergies  Social History:  Ambulatory  Independently  Lives at home  With friend     reports that she has been smoking Cigarettes.  She has been smoking about 1.00 pack per day. She does not have any smokeless tobacco history on file. She reports that she drinks alcohol. She reports that she does not  use illicit drugs.     Family History: family history includes Arthritis in her father; Brain cancer in her other; Breast cancer in her other; Hypertension in her other; Throat cancer in her other.    Physical Exam: Patient Vitals for the past 24 hrs:  BP Temp Temp src Pulse Resp SpO2  03/22/15 1915 102/69 mmHg - - 95 22 94 %  03/22/15 1843 93/62 mmHg - - 95 23 96 %  03/22/15 1800 92/64 mmHg - - 93 13 97 %  03/22/15 1730 (!) 86/60 mmHg - - 98 16 95 %  03/22/15 1715 92/60 mmHg - - 97 13 96 %  03/22/15 1512 98/70 mmHg 99 F (37.2 C) Oral 115 20 100 %  03/22/15 1428 - - - - - 97 %    1. General:  in No Acute distress 2. Psychological: Alert and  Oriented 3. Head/ENT:   Moist   Mucous Membranes                          Head Non traumatic, neck supple                          Normal   Dentition 4. SKIN:  decreased Skin turgor,  Skin clean Dry and intact no rash 5. Heart: Regular rate and rhythm no Murmur, Rub or gallop 6. Lungs: Coarse breath sounds no wheezes or crackles   7. Abdomen: Soft, non-tender, Non distended 8. Lower extremities: no clubbing, cyanosis, or edema 9. Neurologically Grossly intact, moving all 4 extremities equally 10. MSK: Normal range of motion Rest exams breast appear to be dense with no localizing mass noted but diffuse nodularity present  body mass index is unknown because there is no weight on file.   Labs on Admission:   Results for orders placed or performed during the hospital encounter of 03/22/15 (from the past 24 hour(s))  Urinalysis, Routine w reflex microscopic (not at [facility])     Status: Abnormal   Collection Time: 03/22/15  3:34 PM  Result Value Ref Range   Color, Urine AMBER (A) YELLOW   APPearance CLOUDY (A) CLEAR   Specific Gravity, Urine 1.021 1.005 - 1.030   pH 6.0 5.0 - 8.0   Glucose, UA NEGATIVE NEGATIVE mg/dL   Hgb urine dipstick MODERATE (A) NEGATIVE   Bilirubin Urine SMALL (A) NEGATIVE   Ketones, ur 15 (A) NEGATIVE mg/dL    Protein, ur 161 (A) NEGATIVE mg/dL   Nitrite POSITIVE (A) NEGATIVE   Leukocytes, UA LARGE (A) NEGATIVE  Urine microscopic-add on     Status: Abnormal   Collection Time: 03/22/15  3:34 PM  Result Value Ref Range   Squamous Epithelial / LPF 6-30 (A) NONE SEEN   WBC, UA TOO NUMEROUS TO COUNT 0 - 5 WBC/hpf   RBC / HPF 0-5 0 - 5 RBC/hpf   Bacteria, UA MANY (A) NONE SEEN   Urine-Other MUCOUS PRESENT   Lipase, blood     Status: None   Collection Time: 03/22/15  3:36 PM  Result Value Ref Range   Lipase 19 11 - 51 U/L  Comprehensive metabolic  panel     Status: Abnormal   Collection Time: 03/22/15  3:36 PM  Result Value Ref Range   Sodium 137 135 - 145 mmol/L   Potassium 2.7 (LL) 3.5 - 5.1 mmol/L   Chloride 98 (L) 101 - 111 mmol/L   CO2 29 22 - 32 mmol/L   Glucose, Bld 126 (H) 65 - 99 mg/dL   BUN 6 6 - 20 mg/dL   Creatinine, Ser 1.61 0.44 - 1.00 mg/dL   Calcium 8.6 (L) 8.9 - 10.3 mg/dL   Total Protein 7.3 6.5 - 8.1 g/dL   Albumin 2.5 (L) 3.5 - 5.0 g/dL   AST 13 (L) 15 - 41 U/L   ALT 10 (L) 14 - 54 U/L   Alkaline Phosphatase 100 38 - 126 U/L   Total Bilirubin 0.8 0.3 - 1.2 mg/dL   GFR calc non Af Amer >60 >60 mL/min   GFR calc Af Amer >60 >60 mL/min   Anion gap 10 5 - 15  CBC     Status: Abnormal   Collection Time: 03/22/15  3:36 PM  Result Value Ref Range   WBC 31.3 (H) 4.0 - 10.5 K/uL   RBC 3.79 (L) 3.87 - 5.11 MIL/uL   Hemoglobin 10.6 (L) 12.0 - 15.0 g/dL   HCT 09.6 (L) 04.5 - 40.9 %   MCV 84.4 78.0 - 100.0 fL   MCH 28.0 26.0 - 34.0 pg   MCHC 33.1 30.0 - 36.0 g/dL   RDW 81.1 91.4 - 78.2 %   Platelets 445 (H) 150 - 400 K/uL  Magnesium     Status: None   Collection Time: 03/22/15  3:36 PM  Result Value Ref Range   Magnesium 1.9 1.7 - 2.4 mg/dL  I-Stat beta hCG blood, ED ([facility], [facility], [facility] only)     Status: None   Collection Time: 03/22/15  3:57 PM  Result Value Ref Range   I-stat hCG, quantitative <5.0 <5 mIU/mL   Comment 3          I-stat troponin, ED     Status: None    Collection Time: 03/22/15  5:01 PM  Result Value Ref Range   Troponin i, poc 0.00 0.00 - 0.08 ng/mL   Comment 3          I-Stat CG4 Lactic Acid, ED     Status: Abnormal   Collection Time: 03/22/15  5:03 PM  Result Value Ref Range   Lactic Acid, Venous 2.87 (HH) 0.5 - 2.0 mmol/L   Comment NOTIFIED PHYSICIAN   I-Stat CG4 Lactic Acid, ED     Status: None   Collection Time: 03/22/15  7:51 PM  Result Value Ref Range   Lactic Acid, Venous 0.72 0.5 - 2.0 mmol/L    UA evidence of UTI  No results found for: HGBA1C  CrCl cannot be calculated (Unknown ideal weight.).  BNP (last 3 results) No results for input(s): PROBNP in the last 8760 hours.  Other results:  I have pearsonaly reviewed this: ECG REPORT  Rate: 96  Rhythm: NSR ST&T Change: no ischemic changes QTC 517   There were no vitals filed for this visit.   Cultures: No results found for: SDES, SPECREQUEST, CULT, REPTSTATUS   Radiological Exams on Admission: Dg Chest 2 View  03/22/2015  CLINICAL DATA:  Fever to 102 degrees, cough for 3 months, LEFT side chest pain, RIGHT-sided chest tightness smoker, rib pain EXAM: CHEST  2 VIEW COMPARISON:  06/26/2012 FINDINGS: Normal heart size, mediastinal contours, and  pulmonary vascularity. Moderate bronchitic changes new since previous exam. Subsegmental atelectasis at base of lingula, new. No definite pulmonary infiltrate, pleural effusion or pneumothorax. Bones unremarkable. IMPRESSION: Moderate bronchitic changes with subsegmental atelectasis at lingula new since prior exam. Electronically Signed   By: Ulyses Southward M.D.   On: 03/22/2015 15:59   Ct Renal Stone Study  03/22/2015  CLINICAL DATA:  Abdominal pain and back pain for 3 months getting worse, right flank pain EXAM: CT ABDOMEN AND PELVIS WITHOUT CONTRAST TECHNIQUE: Multidetector CT imaging of the abdomen and pelvis was performed following the standard protocol without IV contrast. COMPARISON:  None. FINDINGS: Lower chest: Tiny  pleural effusions. Partially visualized thick walled pleural-based mass with low-attenuation center extending from the sub carinal region to the extreme inferior right lung base. Hepatobiliary: Negative Pancreas: Negative Spleen: Negative Adrenals/Urinary Tract: Adrenal glands are normal. There is moderate bilateral perinephric inflammation. No renal or ureteral stones. No hydronephrosis. Bladder appears normal. Stomach/Bowel: Tiny volume of oral contrast seen in the stomach. Nonobstructive gas pattern. Stomach small bowel and appendix are normal. Large bowel is normal. Vascular/Lymphatic: Mild aortoiliac calcification. 12 mm loop periaortic lymph node at the level of the left renal hilum. Reproductive: Negative Other: Suspect small volume free fluid in the pelvis although difficult to distinguish from adjacent non-opacified loops of bowel. Musculoskeletal: No acute findings IMPRESSION: 1. Bilateral perinephric inflammation without hydronephrosis. Bilateral pyelonephritis is 1 consideration. Systemic active renal disease is another consideration. 2. Pleural-based thick walled mass medial right lung base only partially visualized. This requires further evaluation. Recommend contrast-enhanced CT chest abdomen and pelvis if the patient is a candidate. Electronically Signed   By: Esperanza Heir M.D.   On: 03/22/2015 18:58    Chart has been reviewed  Family not at  Bedside   Assessment/Plan  45 year old female with past history of IV drug use and recent history of pyelonephritis treated with Bactrim presents with mild cough and worsening persistent fever associated with some right flank and chest pain associated some right flank and chest pain  Present on Admission:  . Chest pain atypical worse with coughing and taking deep breath likely more pleuritic in nature. Patient has CT scan that showed no evidence of PE but there is evidence of local related pleural effusion given risk factors will cycle cardiac  enzymes and monitor on telemetry but ACS being unlikely  . Pyelonephritis - Simpson antibiotic coverage to include lung and urine will treat for now the Zosyn  . Sepsis (HCC) - Admit per Sepsis protocol likely source being  UTI versus loculated effusion  - rehydrate with 74ml/kg  - initiate broad spectrum antibiotic  Zosyn  -  obtain blood cultures  - Obtain serial lactic acid  - Obtain procalcitonin level  - Admit and monitor vital signs closely   . Hypokalemia will replace check magnesium level  . Loculated pleural effusion - discussed with cardiology will order interventional radiology consult for possible tap or drainage, if able cultures, cell count and cytology should be obtained  breast nodule will need to have a mammogram set up as an outpatient   history of IV drug use we'll avoid narcotics, patient requested testing for hepatitis C and HIV. Of note patient states she would like to receive her results in privacy  Prophylaxis: SCD    CODE STATUS:  FULL CODE  as per patient    Disposition:   To home once workup is complete and patient is stable  Other plan as per orders.  I  have spent a total of 69 min on this admission  Riely Oetken 03/23/2015, 1:10 AM    [facility]  Pager [phone]   after 2 AM please page floor coverage PA If 7AM-7PM, please contact the day team taking care of the patient  [email or web address]  Password [email or web address]

## 2015-03-22 NOTE — ED Notes (Signed)
Pt transported to CT ?

## 2015-03-22 NOTE — ED Notes (Signed)
Per MD Juleen China, patient may eat and drink

## 2015-03-22 NOTE — ED Notes (Signed)
To nurse first via EMS.  Pt had kidney infection 3 months ago since then pt has had burning to right side of ribs and cramping lower bilateral abd, swelling in right flank.  Onset several days nonproductive cough and fever.  Pt had orange urine yesterday- last took AZO cranberry pills 1 1/2 week ago.

## 2015-03-22 NOTE — ED Notes (Signed)
Attempted report 

## 2015-03-22 NOTE — ED Notes (Signed)
Lab to add on magnesium  

## 2015-03-22 NOTE — ED Provider Notes (Signed)
CSN: [id number]     Arrival date & time 03/22/15  1428 History   First MD Initiated Contact with Patient 03/22/15 1640     Chief Complaint  Patient presents with  . Cough  . Fever  . Chest Pain     (Consider location/radiation/quality/duration/timing/severity/associated sxs/prior Treatment) HPI    this is a 45 year old female with past medical history significant for pyelonephritis in December. Since then and she's been having persistent right sided flank pain no dysuria. 3 days ago she started having fevers as well as cough , cough is nonproductive. No chest pain or shortness of breath.  History reviewed. No pertinent past medical history. History reviewed. No pertinent past surgical history. Family History  Problem Relation Age of Onset  . Arthritis Father   . Throat cancer Other   . Brain cancer Other   . Breast cancer Other   . Hypertension Other    Social History  Substance Use Topics  . Smoking status: Current Every Day Smoker -- 1.00 packs/day    Types: Cigarettes  . Smokeless tobacco: None  . Alcohol Use: Yes     Comment: socially 1 beer a month   OB History    No data available     Review of Systems  Constitutional: Positive for fever and chills.  HENT: Negative for nosebleeds.   Eyes: Negative for visual disturbance.  Respiratory: Positive for cough. Negative for shortness of breath.   Cardiovascular: Negative for chest pain.  Gastrointestinal: Negative for nausea, vomiting, abdominal pain, diarrhea and constipation.  Genitourinary: Positive for flank pain. Negative for dysuria.  Skin: Negative for rash.  Neurological: Negative for weakness.  All other systems reviewed and are negative.     Allergies  Review of patient's allergies indicates no known allergies.  Home Medications   Prior to Admission medications   Medication Sig Start Date End Date Taking? Authorizing Provider  ibuprofen (ADVIL,MOTRIN) 200 MG tablet Take 800 mg by mouth every 4  (four) hours as needed (pain/swelling).    Yes Historical Provider, MD  HYDROcodone-acetaminophen (NORCO/VICODIN) 5-325 MG per tablet Take 1 tablet by mouth every 6 (six) hours as needed for severe pain. Patient not taking: Reported on 03/22/2015 01/30/13   Francee Piccolo, PA-C  oxyCODONE-acetaminophen (PERCOCET/ROXICET) 5-325 MG tablet Take 1 tablet by mouth every 4 (four) hours as needed for severe pain. Patient not taking: Reported on 03/22/2015 02/23/15   Raeford Razor, MD   BP 117/84 mmHg  Pulse 96  Temp(Src) 99 F (37.2 C) (Oral)  Resp 13  Ht  (1.575 m)  Wt 56.5 kg  BMI 22.78 kg/m2  SpO2 94%  LMP 02/23/2015 Physical Exam  Constitutional: She is oriented to person, place, and time. No distress.  HENT:  Head: Normocephalic and atraumatic.  Eyes: EOM are normal. Pupils are equal, round, and reactive to light.  Neck: Normal range of motion. Neck supple.  Cardiovascular: Normal rate and intact distal pulses.   Pulmonary/Chest: No respiratory distress.  Abdominal: Soft. There is tenderness (right upper and lower quadrants).  Genitourinary:  R cva ttp  Musculoskeletal: Normal range of motion.  Neurological: She is alert and oriented to person, place, and time.  Skin: No rash noted. She is not diaphoretic.  Psychiatric: She has a normal mood and affect.    ED Course  Procedures (including critical care time) Labs Review Labs Reviewed  COMPREHENSIVE METABOLIC PANEL - Abnormal; Notable for the following:    Potassium 2.7 (*)    Chloride 98 (*)  Glucose, Bld 126 (*)    Calcium 8.6 (*)    Albumin 2.5 (*)    AST 13 (*)    ALT 10 (*)    All other components within normal limits  CBC - Abnormal; Notable for the following:    WBC 31.3 (*)    RBC 3.79 (*)    Hemoglobin 10.6 (*)    HCT 32.0 (*)    Platelets 445 (*)    All other components within normal limits  URINALYSIS, ROUTINE W REFLEX MICROSCOPIC (NOT AT [facility]) - Abnormal; Notable for the following:    Color,  Urine AMBER (*)    APPearance CLOUDY (*)    Hgb urine dipstick MODERATE (*)    Bilirubin Urine SMALL (*)    Ketones, ur 15 (*)    Protein, ur 100 (*)    Nitrite POSITIVE (*)    Leukocytes, UA LARGE (*)    All other components within normal limits  URINE MICROSCOPIC-ADD ON - Abnormal; Notable for the following:    Squamous Epithelial / LPF 6-30 (*)    Bacteria, UA MANY (*)    All other components within normal limits  I-STAT CG4 LACTIC ACID, ED - Abnormal; Notable for the following:    Lactic Acid, Venous 2.87 (*)    All other components within normal limits  URINE CULTURE  CULTURE, BLOOD (ROUTINE X 2)  CULTURE, BLOOD (ROUTINE X 2)  LIPASE, BLOOD  MAGNESIUM  COMPREHENSIVE METABOLIC PANEL  PROCALCITONIN  PROTIME-INR  APTT  MAGNESIUM  PHOSPHORUS  TSH  COMPREHENSIVE METABOLIC PANEL  CBC  HEPATITIS PANEL, ACUTE  HIV ANTIBODY (ROUTINE TESTING)  CBC WITH DIFFERENTIAL/PLATELET  I-STAT BETA HCG BLOOD, ED ([facility], [facility], [facility] ONLY)  I-STAT TROPOININ, ED  I-STAT CG4 LACTIC ACID, ED    Imaging Review Dg Chest 2 View  03/22/2015  CLINICAL DATA:  Fever to 102 degrees, cough for 3 months, LEFT side chest pain, RIGHT-sided chest tightness smoker, rib pain EXAM: CHEST  2 VIEW COMPARISON:  06/26/2012 FINDINGS: Normal heart size, mediastinal contours, and pulmonary vascularity. Moderate bronchitic changes new since previous exam. Subsegmental atelectasis at base of lingula, new. No definite pulmonary infiltrate, pleural effusion or pneumothorax. Bones unremarkable. IMPRESSION: Moderate bronchitic changes with subsegmental atelectasis at lingula new since prior exam. Electronically Signed   By: Ulyses Southward M.D.   On: 03/22/2015 15:59   Ct Angio Chest Pe W/cm &/or Wo Cm  03/22/2015  CLINICAL DATA:  Fever and cough beginning yesterday. Initial encounter. EXAM: CT ANGIOGRAPHY CHEST WITH CONTRAST TECHNIQUE: Multidetector CT imaging of the chest was performed using the standard protocol during bolus  administration of intravenous contrast. Multiplanar CT image reconstructions and MIPs were obtained to evaluate the vascular anatomy. CONTRAST:  100 mL OMNIPAQUE IOHEXOL 350 MG/ML SOLN COMPARISON:  PA and lateral chest earlier today and single view of the chest 06/26/2012. FINDINGS: No pulmonary embolus is identified. No axillary, hilar or mediastinal lymphadenopathy is seen. The patient has a small right pleural effusion which appears partially loculated in the right paravertebral space. Trace amount of pleural fluid is seen on the left. Heart size is normal. There is no pericardial effusion. The lungs demonstrate emphysematous change. There is some dependent atelectasis and mild atelectasis in the lingula. No consolidative process, nodule or mass is identified. Visualized upper abdomen demonstrates a small amount of fluid about the left kidney but no organized collection. No focal bony abnormality is identified. Review of the MIP images confirms the above findings. IMPRESSION: Negative for pulmonary  embolus. Small right pleural effusion appears partially loculated in the right paravertebral space. Trace left pleural effusion is noted. No consolidative process is identified with mild dependent atelectasis in the subsegmental atelectasis in lingula. Emphysema. Electronically Signed   By: Drusilla Kanner M.D.   On: 03/22/2015 21:44   Ct Renal Stone Study  03/22/2015  CLINICAL DATA:  Abdominal pain and back pain for 3 months getting worse, right flank pain EXAM: CT ABDOMEN AND PELVIS WITHOUT CONTRAST TECHNIQUE: Multidetector CT imaging of the abdomen and pelvis was performed following the standard protocol without IV contrast. COMPARISON:  None. FINDINGS: Lower chest: Tiny pleural effusions. Partially visualized thick walled pleural-based mass with low-attenuation center extending from the sub carinal region to the extreme inferior right lung base. Hepatobiliary: Negative Pancreas: Negative Spleen: Negative  Adrenals/Urinary Tract: Adrenal glands are normal. There is moderate bilateral perinephric inflammation. No renal or ureteral stones. No hydronephrosis. Bladder appears normal. Stomach/Bowel: Tiny volume of oral contrast seen in the stomach. Nonobstructive gas pattern. Stomach small bowel and appendix are normal. Large bowel is normal. Vascular/Lymphatic: Mild aortoiliac calcification. 12 mm loop periaortic lymph node at the level of the left renal hilum. Reproductive: Negative Other: Suspect small volume free fluid in the pelvis although difficult to distinguish from adjacent non-opacified loops of bowel. Musculoskeletal: No acute findings IMPRESSION: 1. Bilateral perinephric inflammation without hydronephrosis. Bilateral pyelonephritis is 1 consideration. Systemic active renal disease is another consideration. 2. Pleural-based thick walled mass medial right lung base only partially visualized. This requires further evaluation. Recommend contrast-enhanced CT chest abdomen and pelvis if the patient is a candidate. Electronically Signed   By: Esperanza Heir M.D.   On: 03/22/2015 18:58   I have personally reviewed and evaluated these images and lab results as part of my medical decision-making.   EKG Interpretation None      MDM   Final diagnoses:  1.  Right flank pain 2.  Pyelonephritis.       45 year old female with past pedicle history of pyelo in December who comes in with persistent right sided flank pain and fever for last 3 days associated with new cough   exam as above right-sided CVA tenderness as well as abdominal tenderness to palpation.  Concern for pyelo versus kidney stone. We'll obtain UA, CBC , BMP, lactic acid , urine culture.  UA appears  Infected, lactic acid and mildly elevated. We'll give a fluid bolus. We'll cover with Rocephin. We'll obtain CT stone study. We'll plan to admit for pyelonephritis is failed outpatient managment 1.  Ct w/ incidentally visualized right lung  mass.  Will obtain cta pe study to further evaluate R lung mass  Will admit to medicine.  No acute events in the ED.     Silas Flood, MD 03/23/15 0100  Raeford Razor, MD 03/30/15 [id number]

## 2015-03-23 ENCOUNTER — Inpatient Hospital Stay (HOSPITAL_COMMUNITY): Payer: Self-pay

## 2015-03-23 DIAGNOSIS — A419 Sepsis, unspecified organism: Principal | ICD-10-CM

## 2015-03-23 DIAGNOSIS — E876 Hypokalemia: Secondary | ICD-10-CM

## 2015-03-23 DIAGNOSIS — J90 Pleural effusion, not elsewhere classified: Secondary | ICD-10-CM

## 2015-03-23 DIAGNOSIS — Z87898 Personal history of other specified conditions: Secondary | ICD-10-CM | POA: Diagnosis present

## 2015-03-23 DIAGNOSIS — N12 Tubulo-interstitial nephritis, not specified as acute or chronic: Secondary | ICD-10-CM

## 2015-03-23 DIAGNOSIS — F1991 Other psychoactive substance use, unspecified, in remission: Secondary | ICD-10-CM | POA: Diagnosis present

## 2015-03-23 DIAGNOSIS — N63 Unspecified lump in unspecified breast: Secondary | ICD-10-CM | POA: Diagnosis present

## 2015-03-23 LAB — TROPONIN I
TROPONIN I: 0.37 ng/mL — AB (ref <0.031)
TROPONIN I: 0.64 ng/mL — AB (ref <0.031)
TROPONIN I: 0.13 ng/mL — AB (ref <0.031)

## 2015-03-23 LAB — CBC
HCT: 26.9 % — ABNORMAL LOW (ref 36.0–46.0)
Hemoglobin: 8.8 g/dL — ABNORMAL LOW (ref 12.0–15.0)
MCH: 27.9 pg (ref 26.0–34.0)
MCHC: 32.7 g/dL (ref 30.0–36.0)
MCV: 85.4 fL (ref 78.0–100.0)
Platelets: 359 10*3/uL (ref 150–400)
RBC: 3.15 MIL/uL — ABNORMAL LOW (ref 3.87–5.11)
RDW: 15.5 % (ref 11.5–15.5)
WBC: 21.5 10*3/uL — ABNORMAL HIGH (ref 4.0–10.5)

## 2015-03-23 LAB — CBC WITH DIFFERENTIAL/PLATELET
BASOS ABS: 0 10*3/uL (ref 0.0–0.1)
Basophils Relative: 0 %
EOS ABS: 0.1 10*3/uL (ref 0.0–0.7)
EOS PCT: 0 %
HCT: 26.9 % — ABNORMAL LOW (ref 36.0–46.0)
Hemoglobin: 8.5 g/dL — ABNORMAL LOW (ref 12.0–15.0)
LYMPHS ABS: 1.3 10*3/uL (ref 0.7–4.0)
Lymphocytes Relative: 7 %
MCH: 26.9 pg (ref 26.0–34.0)
MCHC: 31.6 g/dL (ref 30.0–36.0)
MCV: 85.1 fL (ref 78.0–100.0)
MONO ABS: 1.5 10*3/uL — AB (ref 0.1–1.0)
Monocytes Relative: 7 %
Neutro Abs: 17 10*3/uL — ABNORMAL HIGH (ref 1.7–7.7)
Neutrophils Relative %: 86 %
PLATELETS: 393 10*3/uL (ref 150–400)
RBC: 3.16 MIL/uL — AB (ref 3.87–5.11)
RDW: 15.2 % (ref 11.5–15.5)
WBC: 19.8 10*3/uL — AB (ref 4.0–10.5)

## 2015-03-23 LAB — COMPREHENSIVE METABOLIC PANEL
ALBUMIN: 1.9 g/dL — AB (ref 3.5–5.0)
ALK PHOS: 83 U/L (ref 38–126)
ALK PHOS: 92 U/L (ref 38–126)
ALT: 10 U/L — AB (ref 14–54)
ALT: 11 U/L — AB (ref 14–54)
AST: 14 U/L — ABNORMAL LOW (ref 15–41)
AST: 12 U/L — AB (ref 15–41)
Albumin: 2 g/dL — ABNORMAL LOW (ref 3.5–5.0)
Anion gap: 9 (ref 5–15)
Anion gap: 9 (ref 5–15)
BILIRUBIN TOTAL: 0.8 mg/dL (ref 0.3–1.2)
BUN: 6 mg/dL (ref 6–20)
CALCIUM: 7.6 mg/dL — AB (ref 8.9–10.3)
CALCIUM: 7.7 mg/dL — AB (ref 8.9–10.3)
CHLORIDE: 104 mmol/L (ref 101–111)
CO2: 24 mmol/L (ref 22–32)
CO2: 26 mmol/L (ref 22–32)
CREATININE: 0.75 mg/dL (ref 0.44–1.00)
Chloride: 103 mmol/L (ref 101–111)
Creatinine, Ser: 0.73 mg/dL (ref 0.44–1.00)
GFR calc Af Amer: >60 mL/min (ref >60)
GFR calc non Af Amer: >60 mL/min (ref >60)
GLUCOSE: 99 mg/dL (ref 65–99)
Glucose, Bld: 119 mg/dL — ABNORMAL HIGH (ref 65–99)
Potassium: 3.6 mmol/L (ref 3.5–5.1)
Potassium: 3.2 mmol/L — ABNORMAL LOW (ref 3.5–5.1)
Sodium: 137 mmol/L (ref 135–145)
Sodium: 138 mmol/L (ref 135–145)
TOTAL PROTEIN: 6.2 g/dL — AB (ref 6.5–8.1)
Total Bilirubin: 0.3 mg/dL (ref 0.3–1.2)
Total Protein: 6.1 g/dL — ABNORMAL LOW (ref 6.5–8.1)

## 2015-03-23 LAB — MAGNESIUM: Magnesium: 1.6 mg/dL — ABNORMAL LOW (ref 1.7–2.4)

## 2015-03-23 LAB — PHOSPHORUS: Phosphorus: 3 mg/dL (ref 2.5–4.6)

## 2015-03-23 LAB — APTT: aPTT: 36 seconds (ref 24–37)

## 2015-03-23 LAB — PROTIME-INR
INR: 1.23 (ref 0.00–1.49)
PROTHROMBIN TIME: 15.6 s — AB (ref 11.6–15.2)

## 2015-03-23 LAB — PROCALCITONIN: PROCALCITONIN: 0.3 ng/mL

## 2015-03-23 LAB — HIV ANTIBODY (ROUTINE TESTING W REFLEX): HIV SCREEN 4TH GENERATION: NONREACTIVE

## 2015-03-23 LAB — TSH: TSH: 1.18 u[IU]/mL (ref 0.350–4.500)

## 2015-03-23 MED ORDER — SODIUM CHLORIDE 0.9 % IV BOLUS (SEPSIS)
500.0000 mL | Freq: Once | INTRAVENOUS | Status: AC
  Administered 2015-03-24: 500 mL via INTRAVENOUS

## 2015-03-23 MED ORDER — NICOTINE 21 MG/24HR TD PT24
21.0000 mg | MEDICATED_PATCH | Freq: Every day | TRANSDERMAL | Status: DC
  Administered 2015-03-23 – 2015-03-24 (×2): 21 mg via TRANSDERMAL
  Filled 2015-03-23 (×2): qty 1

## 2015-03-23 MED ORDER — MORPHINE SULFATE (PF) 2 MG/ML IV SOLN
2.0000 mg | INTRAVENOUS | Status: DC | PRN
  Administered 2015-03-23 (×3): 2 mg via INTRAVENOUS
  Filled 2015-03-23 (×3): qty 1

## 2015-03-23 MED ORDER — DEXTROSE 5 % IV SOLN
1.0000 g | Freq: Every day | INTRAVENOUS | Status: DC
  Administered 2015-03-23: 1 g via INTRAVENOUS
  Filled 2015-03-23 (×2): qty 10

## 2015-03-23 MED ORDER — NICOTINE 21 MG/24HR TD PT24: 21.0000 mg | MEDICATED_PATCH | Freq: Every day | TRANSDERMAL | Status: DC

## 2015-03-23 MED ORDER — HYDROMORPHONE HCL 1 MG/ML IJ SOLN
1.0000 mg | INTRAMUSCULAR | Status: DC | PRN
  Administered 2015-03-23 – 2015-03-26 (×16): 1 mg via INTRAVENOUS
  Filled 2015-03-23 (×16): qty 1

## 2015-03-23 NOTE — Progress Notes (Addendum)
CRITICAL VALUE ALERT  Critical value received:  Troponin = .64  Date of notification:  03/23/2015  Time of notification:  12:34  Critical value read back:Yes.    Nurse who received alert:  Lurena Nida  MD notified (1st page):  Dr. Butler Denmark  Time of first page:  12:35  MD notified (2nd page): Dr. Butler Denmark  Time of second page: 12:56  Responding MD:  Dr. Butler Denmark  Time MD responded:  01:15

## 2015-03-23 NOTE — Care Management Note (Signed)
Case Management Note  Patient Details  Name: Chelsea Blake MRN: [id number] Date of Birth: 27-Mar-1970  Subjective/Objective:                 Admitted with Sepsis and Pyelonephritis Possible lung mass. Independent with ADL's. No DME usage.   Action/Plan:  Pt states she is homeless x 5 months and without a job x 79yrs. Pt with no PCP or insurance. CM made referral to CSW regarding pt's homelessness. CM scheduled f/u appointment with [facility] to establish PCP/ medication needs. CM to f/u with disposition needs.  Expected Discharge Date:                  Expected Discharge Plan:  Home/Self Care  In-House Referral:  Clinical Social Work (homeless, no job x2 yrs)  Discharge planning Services  CM Consult, Medication Assistance, Follow-up appt scheduled  Post Acute Care Choice:    Choice offered to:     DME Arranged:    DME Agency:     HH Arranged:    HH Agency:     Status of Service:  In process, will continue to follow  Medicare Important Message Given:    Date Medicare IM Given:    Medicare IM give by:    Date Additional Medicare IM Given:    Additional Medicare Important Message give by:     If discussed at Long Length of Stay Meetings, dates discussed:    Additional Comments:  Epifanio Lesches, RN 03/23/2015, 11:47 AM

## 2015-03-23 NOTE — Progress Notes (Signed)
UR COMPLETED  

## 2015-03-23 NOTE — Progress Notes (Addendum)
[facility] Progress Note   Chelsea Blake  ZOX:[id number]  DOB: 04/08/1970  DOA: 03/22/2015 PCP: No PCP Per Patient  Brief narrative: Chelsea Blake is a 45 y.o. female with UTI recently treated with Bactrim presenting with b/l flank pain and fever. Found to have b/l Pyelonephritis and incedental small loculated fluid collection near the right lung/ vertebral area.    Subjective: Lumbar pain continues. No nausea, vomiting, diarrhea, cough or dyspnea.   Assessment/Plan: Principal Problem:   Pyelonephritis with sepsis - cont Rocephin and follow cultures- gr neg rods growing in Urine - WBC count improving  Active Problems:   Hypokalemia - replace  Chest pain - very mild elevated in Troponin= follow - no h/o CAD  Smoker - cont Nicotine patch- advised to stop smoking    Loculated pleural effusion - small amount of fluid-discussed with IR no urgency to drain this -  treat above and follow fluid collection with repeat CT in 1 wk    History of intravenous drug use in remission    Breast nodule - need outpt follow up    Antibiotics: Anti-infectives    Start     Dose/Rate Route Frequency Ordered Stop   03/23/15 1000  cefTRIAXone (ROCEPHIN) 1 g in dextrose 5 % 50 mL IVPB     1 g 100 mL/hr over 30 Minutes Intravenous Daily 03/23/15 0826     03/22/15 2359  piperacillin-tazobactam (ZOSYN) IVPB 3.375 g  Status:  Discontinued     3.375 g 12.5 mL/hr over 240 Minutes Intravenous 3 times per day 03/22/15 2317 03/23/15 0826   03/22/15 1700  cefTRIAXone (ROCEPHIN) 1 g in dextrose 5 % 50 mL IVPB     1 g 100 mL/hr over 30 Minutes Intravenous  Once 03/22/15 1653 03/22/15 1810     Code Status:     Code Status Orders        Start     Ordered   03/22/15 2318  Full code   Continuous     03/22/15 2317    Code Status History    Date Active Date Inactive Code Status Order ID Comments User Context   This patient has a current code status but no historical code  status.     Family Communication:   Disposition Plan: home when stable DVT prophylaxis: Lovenox Consultants:  IR Procedures:  none    Objective: Filed Weights   03/22/15 2310  Weight: 56.5 kg (124 lb 9 oz)    Intake/Output Summary (Last 24 hours) at 03/23/15 1143 Last data filed at 03/23/15 1029  Gross per 24 hour  Intake 6386.67 ml  Output      0 ml  Net 6386.67 ml     Vitals Filed Vitals:   03/22/15 2045 03/22/15 2215 03/22/15 2310 03/23/15 0500  BP: 99/71 127/64 117/84 96/62  Pulse: 91 93 96 101  Temp:   99 F (37.2 C) 99.4 F (37.4 C)  TempSrc:   Oral Oral  Resp: Height:    (1.575 m)   Weight:   56.5 kg (124 lb 9 oz)   SpO2: 95% 97% 94% 92%    Exam:  General:  Pt is alert, not in acute distress  HEENT: No icterus, No thrush, oral mucosa moist  Cardiovascular: regular rate and rhythm, S1/S2 No murmur  Respiratory: clear to auscultation bilaterally   Abdomen: Soft, +Bowel sounds, non tender, non distended, no guarding- b/l flank tenderness   MSK: No cyanosis or  clubbing- no pedal edema   Data Reviewed: Basic Metabolic Panel:  Recent Labs Lab 03/22/15 1536 03/23/15 0019 03/23/15 0626  NA 137 137 138  K 2.7* 3.6 3.2*  CL 98* 104 103  CO2 GLUCOSE 126* 119* 99  BUN 6 6 <5*  CREATININE 0.81 0.75 0.73  CALCIUM 8.6* 7.7* 7.6*  MG 1.9  --  1.6*  PHOS  --   --  3.0   Liver Function Tests:  Recent Labs Lab 03/22/15 1536 03/23/15 0019 03/23/15 0626  AST 13* 14* 12*  ALT 10* 10* 11*  ALKPHOS 100 83 92  BILITOT 0.8 0.3 0.8  PROT 7.3 6.1* 6.2*  ALBUMIN 2.5* 2.0* 1.9*    Recent Labs Lab 03/22/15 1536  LIPASE 19   No results for input(s): AMMONIA in the last 168 hours. CBC:  Recent Labs Lab 03/22/15 1536 03/23/15 0019 03/23/15 0626  WBC 31.3* 19.8* 21.5*  NEUTROABS  --  17.0*  --   HGB 10.6* 8.5* 8.8*  HCT 32.0* 26.9* 26.9*  MCV 84.4 85.1 85.4  PLT 445* 393 359   Cardiac Enzymes:  Recent  Labs Lab 03/23/15 0626  TROPONINI 0.37*   BNP (last 3 results) No results for input(s): BNP in the last 8760 hours.  ProBNP (last 3 results) No results for input(s): PROBNP in the last 8760 hours.  CBG: No results for input(s): GLUCAP in the last 168 hours.  Recent Results (from the past 240 hour(s))  Urine culture     Status: None (Preliminary result)   Collection Time: 03/22/15  3:34 PM  Result Value Ref Range Status   Specimen Description URINE, RANDOM  Final   Special Requests NONE  Final   Culture   Final    >=100,000 COLONIES/mL GRAM NEGATIVE RODS CULTURE REINCUBATED FOR BETTER GROWTH    Report Status PENDING  Incomplete     Studies: Dg Chest 2 View  03/22/2015  CLINICAL DATA:  Fever to 102 degrees, cough for 3 months, LEFT side chest pain, RIGHT-sided chest tightness smoker, rib pain EXAM: CHEST  2 VIEW COMPARISON:  06/26/2012 FINDINGS: Normal heart size, mediastinal contours, and pulmonary vascularity. Moderate bronchitic changes new since previous exam. Subsegmental atelectasis at base of lingula, new. No definite pulmonary infiltrate, pleural effusion or pneumothorax. Bones unremarkable. IMPRESSION: Moderate bronchitic changes with subsegmental atelectasis at lingula new since prior exam. Electronically Signed   By: Ulyses Southward M.D.   On: 03/22/2015 15:59   Ct Angio Chest Pe W/cm &/or Wo Cm  03/22/2015  CLINICAL DATA:  Fever and cough beginning yesterday. Initial encounter. EXAM: CT ANGIOGRAPHY CHEST WITH CONTRAST TECHNIQUE: Multidetector CT imaging of the chest was performed using the standard protocol during bolus administration of intravenous contrast. Multiplanar CT image reconstructions and MIPs were obtained to evaluate the vascular anatomy. CONTRAST:  100 mL OMNIPAQUE IOHEXOL 350 MG/ML SOLN COMPARISON:  PA and lateral chest earlier today and single view of the chest 06/26/2012. FINDINGS: No pulmonary embolus is identified. No axillary, hilar or mediastinal  lymphadenopathy is seen. The patient has a small right pleural effusion which appears partially loculated in the right paravertebral space. Trace amount of pleural fluid is seen on the left. Heart size is normal. There is no pericardial effusion. The lungs demonstrate emphysematous change. There is some dependent atelectasis and mild atelectasis in the lingula. No consolidative process, nodule or mass is identified. Visualized upper abdomen demonstrates a small amount of fluid about the left kidney but no organized collection.  No focal bony abnormality is identified. Review of the MIP images confirms the above findings. IMPRESSION: Negative for pulmonary embolus. Small right pleural effusion appears partially loculated in the right paravertebral space. Trace left pleural effusion is noted. No consolidative process is identified with mild dependent atelectasis in the subsegmental atelectasis in lingula. Emphysema. Electronically Signed   By: Drusilla Kanner M.D.   On: 03/22/2015 21:44   Ct Renal Stone Study  03/22/2015  CLINICAL DATA:  Abdominal pain and back pain for 3 months getting worse, right flank pain EXAM: CT ABDOMEN AND PELVIS WITHOUT CONTRAST TECHNIQUE: Multidetector CT imaging of the abdomen and pelvis was performed following the standard protocol without IV contrast. COMPARISON:  None. FINDINGS: Lower chest: Tiny pleural effusions. Partially visualized thick walled pleural-based mass with low-attenuation center extending from the sub carinal region to the extreme inferior right lung base. Hepatobiliary: Negative Pancreas: Negative Spleen: Negative Adrenals/Urinary Tract: Adrenal glands are normal. There is moderate bilateral perinephric inflammation. No renal or ureteral stones. No hydronephrosis. Bladder appears normal. Stomach/Bowel: Tiny volume of oral contrast seen in the stomach. Nonobstructive gas pattern. Stomach small bowel and appendix are normal. Large bowel is normal. Vascular/Lymphatic:  Mild aortoiliac calcification. 12 mm loop periaortic lymph node at the level of the left renal hilum. Reproductive: Negative Other: Suspect small volume free fluid in the pelvis although difficult to distinguish from adjacent non-opacified loops of bowel. Musculoskeletal: No acute findings IMPRESSION: 1. Bilateral perinephric inflammation without hydronephrosis. Bilateral pyelonephritis is 1 consideration. Systemic active renal disease is another consideration. 2. Pleural-based thick walled mass medial right lung base only partially visualized. This requires further evaluation. Recommend contrast-enhanced CT chest abdomen and pelvis if the patient is a candidate. Electronically Signed   By: Esperanza Heir M.D.   On: 03/22/2015 18:58    Scheduled Meds:  Scheduled Meds: . cefTRIAXone (ROCEPHIN)  IV  1 g Intravenous Daily  . guaiFENesin  600 mg Oral BID  . nicotine  21 mg Transdermal Daily  . sodium chloride flush  3 mL Intravenous Q12H   Continuous Infusions:   Time spent on care of this patient: 35 min   Leroy Pettway, MD 03/23/2015, 11:43 AM  LOS: 1 day   [facility] Office  [phone] Pager - Text Page per [email or web address] If 7PM-7AM, please contact night-coverage [email or web address]

## 2015-03-24 ENCOUNTER — Inpatient Hospital Stay (HOSPITAL_COMMUNITY): Payer: Self-pay

## 2015-03-24 DIAGNOSIS — F1990 Other psychoactive substance use, unspecified, uncomplicated: Secondary | ICD-10-CM

## 2015-03-24 LAB — BASIC METABOLIC PANEL
ANION GAP: 4 — AB (ref 5–15)
CHLORIDE: 104 mmol/L (ref 101–111)
CO2: 29 mmol/L (ref 22–32)
Calcium: 7.8 mg/dL — ABNORMAL LOW (ref 8.9–10.3)
Creatinine, Ser: 0.57 mg/dL (ref 0.44–1.00)
GFR calc Af Amer: >60 mL/min (ref >60)
GLUCOSE: 99 mg/dL (ref 65–99)
POTASSIUM: 3.5 mmol/L (ref 3.5–5.1)
Sodium: 137 mmol/L (ref 135–145)

## 2015-03-24 LAB — CBC
HEMATOCRIT: 26.1 % — AB (ref 36.0–46.0)
HEMOGLOBIN: 8.2 g/dL — AB (ref 12.0–15.0)
MCH: 26.7 pg (ref 26.0–34.0)
MCHC: 31.4 g/dL (ref 30.0–36.0)
MCV: 85 fL (ref 78.0–100.0)
Platelets: 367 10*3/uL (ref 150–400)
RBC: 3.07 MIL/uL — ABNORMAL LOW (ref 3.87–5.11)
RDW: 15.2 % (ref 11.5–15.5)
WBC: 12.8 10*3/uL — AB (ref 4.0–10.5)

## 2015-03-24 LAB — MAGNESIUM: Magnesium: 1.6 mg/dL — ABNORMAL LOW (ref 1.7–2.4)

## 2015-03-24 LAB — URINE CULTURE

## 2015-03-24 LAB — HEPATITIS PANEL, ACUTE
HEP A IGM: NEGATIVE
HEP B C IGM: NEGATIVE
Hepatitis B Surface Ag: NEGATIVE

## 2015-03-24 MED ORDER — SODIUM CHLORIDE 0.9 % IV SOLN
1.0000 g | INTRAVENOUS | Status: DC
  Administered 2015-03-24 – 2015-03-26 (×3): 1 g via INTRAVENOUS
  Filled 2015-03-24 (×3): qty 1

## 2015-03-24 MED ORDER — POTASSIUM CHLORIDE CRYS ER 20 MEQ PO TBCR
40.0000 meq | EXTENDED_RELEASE_TABLET | Freq: Once | ORAL | Status: AC
  Administered 2015-03-24: 40 meq via ORAL
  Filled 2015-03-24: qty 2

## 2015-03-24 MED ORDER — DICLOFENAC SODIUM 1 % TD GEL
4.0000 g | Freq: Four times a day (QID) | TRANSDERMAL | Status: DC
  Administered 2015-03-24 – 2015-03-26 (×6): 4 g via TOPICAL
  Filled 2015-03-24: qty 100

## 2015-03-24 MED ORDER — SODIUM CHLORIDE 0.9 % IV SOLN: 3.0000 g | Freq: Four times a day (QID) | INTRAVENOUS | Status: DC

## 2015-03-24 NOTE — NC FL2 (Signed)
[place] MEDICAID FL2 LEVEL OF CARE SCREENING TOOL     IDENTIFICATION  Patient Name: Chelsea Blake Birthdate: [birth year] Sex: female Admission Date (Current Location): 03/22/2015  [facility] and [place] Number:  Producer, television/film/video and Address:  [facility], [address], [place], [place] [address]      Provider Number: [id number]  Attending Physician Name and Address:  Calvert Cantor, MD  Relative Name and Phone Number:  Buckner [place] [phone]    Current Level of Care: Hospital Recommended Level of Care: Skilled Nursing Facility Prior Approval Number:    Date Approved/Denied:   PASRR Number: [id number]  Discharge Plan: SNF    Current Diagnoses: Patient Active Problem List   Diagnosis Date Noted  . History of intravenous drug use in remission 03/23/2015  . Breast nodule 03/23/2015  . Pyelonephritis 03/22/2015  . Sepsis (HCC) 03/22/2015  . Hypokalemia 03/22/2015  . Loculated pleural effusion 03/22/2015    Orientation RESPIRATION BLADDER Height & Weight    Self, Time, Situation, Place  Normal Continent  (157.5 cm) 124 lbs.  BEHAVIORAL SYMPTOMS/MOOD NEUROLOGICAL BOWEL NUTRITION STATUS      Continent  (Please see DC summary)  AMBULATORY STATUS COMMUNICATION OF NEEDS Skin   Independent Verbally Normal                       Personal Care Assistance Level of Assistance  Bathing, Feeding, Dressing Bathing Assistance: Independent Feeding assistance: Independent Dressing Assistance: Independent     Functional Limitations Info             SPECIAL CARE FACTORS FREQUENCY                       Contractures      Additional Factors Info  Code Status, Allergies Code Status Info: Full Allergies Info: NKA           Current Medications (03/24/2015):  This is the current hospital active medication list Current Facility-Administered Medications  Medication Dose Route Frequency Provider Last Rate Last Dose  .  acetaminophen (TYLENOL) tablet 650 mg  650 mg Oral Q6H PRN Therisa Doyne, MD   650 mg at 03/23/15 2205   Or  . acetaminophen (TYLENOL) suppository 650 mg  650 mg Rectal Q6H PRN Therisa Doyne, MD      . albuterol (PROVENTIL) (2.5 MG/3ML) 0.083% nebulizer solution 2.5 mg  2.5 mg Nebulization Q2H PRN Therisa Doyne, MD      . diclofenac sodium (VOLTAREN) 1 % transdermal gel 4 g  4 g Topical QID Calvert Cantor, MD   4 g at 03/24/15 1148  . ertapenem (INVANZ) 1 g in sodium chloride 0.9 % 50 mL IVPB  1 g Intravenous Q24H Calvert Cantor, MD   1 g at 03/24/15 1151  . guaiFENesin (MUCINEX) 12 hr tablet 600 mg  600 mg Oral BID Therisa Doyne, MD   600 mg at 03/24/15 1152  . HYDROmorphone (DILAUDID) injection 1 mg  1 mg Intravenous Q4H PRN Calvert Cantor, MD   1 mg at 03/24/15 1249  . ondansetron (ZOFRAN) tablet 4 mg  4 mg Oral Q6H PRN Therisa Doyne, MD   4 mg at 03/23/15 1833   Or  . ondansetron (ZOFRAN) injection 4 mg  4 mg Intravenous Q6H PRN Therisa Doyne, MD   4 mg at 03/24/15 1204  . sodium chloride flush (NS) 0.9 % injection 3 mL  3 mL Intravenous Q12H Therisa Doyne, MD  3 mL at 03/24/15 1250     Discharge Medications: Please see discharge summary for a list of discharge medications.  Relevant Imaging Results:  Relevant Lab Results:   Additional Information SSN: [id number] [place], [place]

## 2015-03-24 NOTE — Progress Notes (Signed)
  Echocardiogram 2D Echocardiogram has been performed.  Cathie Beams 03/24/2015, 10:56 AM

## 2015-03-24 NOTE — Progress Notes (Signed)
Over the course of the night, Pt had an occurrence of loose stool. MD informed, will continue to monitor.  Pt had a fever temperature of 101.19f at around 10pm. Tylenol was given and the temp dropped to 98.2 and then to 97.6. MD made aware. We will con

## 2015-03-24 NOTE — Progress Notes (Signed)
[facility] Progress Note   Chelsea Blake  ZOX:[id number]  DOB: 09/17/1970  DOA: 03/22/2015 PCP: No PCP Per Patient  Brief narrative: Chelsea Blake is a 45 y.o. female with UTI recently treated with Bactrim presenting with b/l flank pain and fever. Found to have b/l Pyelonephritis and incedental small loculated fluid collection near the right lung/ vertebral area.    Subjective: Lumbar pain continues. Fever last night. No nausea, vomiting, diarrhea, cough or dyspnea.   Assessment/Plan: Principal Problem:   Pyelonephritis with sepsis - continues to have flank pain  - culture results today reveal E coli- ESBL- will change to Invanz today and plan for PICC and 2 wks or antibiotics- d/w ID  Active Problems:   Hypokalemia - replaced  Chest pain- right sided - very mild elevated in Troponin= follow - no h/o CAD - will order Voltaren gel for pain   Smoker - cont Nicotine patch- advised to stop smoking    Loculated pleural effusion - small amount of fluid-discussed with IR no urgency to drain this -  treat above and follow fluid collection with repeat CT in 1 wk    History of intravenous drug use in remission    Breast nodule - need outpt follow up    Antibiotics: Anti-infectives    Start     Dose/Rate Route Frequency Ordered Stop   03/24/15 1030  ertapenem (INVANZ) 1 g in sodium chloride 0.9 % 50 mL IVPB     1 g 100 mL/hr over 30 Minutes Intravenous Every 24 hours 03/24/15 0923     03/24/15 0900  Ampicillin-Sulbactam (UNASYN) 3 g in sodium chloride 0.9 % 100 mL IVPB  Status:  Discontinued     3 g 100 mL/hr over 60 Minutes Intravenous Every 6 hours 03/24/15 0858 03/24/15 0924   03/23/15 1000  cefTRIAXone (ROCEPHIN) 1 g in dextrose 5 % 50 mL IVPB  Status:  Discontinued     1 g 100 mL/hr over 30 Minutes Intravenous Daily 03/23/15 0826 03/24/15 0858   03/22/15 2359  piperacillin-tazobactam (ZOSYN) IVPB 3.375 g  Status:  Discontinued     3.375 g 12.5 mL/hr  over 240 Minutes Intravenous 3 times per day 03/22/15 2317 03/23/15 0826   03/22/15 1700  cefTRIAXone (ROCEPHIN) 1 g in dextrose 5 % 50 mL IVPB     1 g 100 mL/hr over 30 Minutes Intravenous  Once 03/22/15 1653 03/22/15 1810     Code Status:     Code Status Orders        Start     Ordered   03/22/15 2318  Full code   Continuous     03/22/15 2317    Code Status History    Date Active Date Inactive Code Status Order ID Comments User Context   This patient has a current code status but no historical code status.     Family Communication:   Disposition Plan: home when stable DVT prophylaxis: Lovenox Consultants:  IR Procedures:  none    Objective: Filed Weights   03/22/15 2310  Weight: 56.5 kg (124 lb 9 oz)    Intake/Output Summary (Last 24 hours) at 03/24/15 1140 Last data filed at 03/24/15 0506  Gross per 24 hour  Intake    220 ml  Output    200 ml  Net     20 ml     Vitals Filed Vitals:   03/23/15 2158 03/23/15 2330 03/24/15 0040 03/24/15 0505  BP:    119/83  Pulse:  118   98  Temp: 101.1 F (38.4 C) 98.2 F (36.8 C) 98.8 F (37.1 C) 97.6 F (36.4 C)  TempSrc: Oral Axillary Oral Oral  Resp:    18  Height:      Weight:      SpO2: 91%   98%    Exam:  General:  Pt is alert, not in acute distress  HEENT: No icterus, No thrush, oral mucosa moist  Cardiovascular: regular rate and rhythm, S1/S2 No murmur  Respiratory: clear to auscultation bilaterally   Abdomen: Soft, +Bowel sounds, non tender, non distended, no guarding- b/l flank tenderness   MSK: No cyanosis or clubbing- no pedal edema   Data Reviewed: Basic Metabolic Panel:  Recent Labs Lab 03/22/15 1536 03/23/15 0019 03/23/15 0626 03/24/15 0630  NA 137 137 138 137  K 2.7* 3.6 3.2* 3.5  CL 98* 104 103 104  CO2 GLUCOSE 126* 119* 99 99  BUN 6 6 <5* <5*  CREATININE 0.81 0.75 0.73 0.57  CALCIUM 8.6* 7.7* 7.6* 7.8*  MG 1.9  --  1.6* 1.6*  PHOS  --   --  3.0  --    Liver  Function Tests:  Recent Labs Lab 03/22/15 1536 03/23/15 0019 03/23/15 0626  AST 13* 14* 12*  ALT 10* 10* 11*  ALKPHOS 100 83 92  BILITOT 0.8 0.3 0.8  PROT 7.3 6.1* 6.2*  ALBUMIN 2.5* 2.0* 1.9*    Recent Labs Lab 03/22/15 1536  LIPASE 19   No results for input(s): AMMONIA in the last 168 hours. CBC:  Recent Labs Lab 03/22/15 1536 03/23/15 0019 03/23/15 0626 03/24/15 0630  WBC 31.3* 19.8* 21.5* 12.8*  NEUTROABS  --  17.0*  --   --   HGB 10.6* 8.5* 8.8* 8.2*  HCT 32.0* 26.9* 26.9* 26.1*  MCV 84.4 85.1 85.4 85.0  PLT 445* 393 359 367   Cardiac Enzymes:  Recent Labs Lab 03/23/15 0626 03/23/15 1125 03/23/15 1650  TROPONINI 0.37* 0.64* 0.13*   BNP (last 3 results) No results for input(s): BNP in the last 8760 hours.  ProBNP (last 3 results) No results for input(s): PROBNP in the last 8760 hours.  CBG: No results for input(s): GLUCAP in the last 168 hours.  Recent Results (from the past 240 hour(s))  Urine culture     Status: None   Collection Time: 03/22/15  3:34 PM  Result Value Ref Range Status   Specimen Description URINE, RANDOM  Final   Special Requests NONE  Final   Culture   Final    >=100,000 COLONIES/mL ESCHERICHIA COLI Confirmed Extended Spectrum Beta-Lactamase Producer (ESBL)    Report Status 03/24/2015 FINAL  Final   Organism ID, Bacteria ESCHERICHIA COLI  Final      Susceptibility   Escherichia coli - MIC*    AMPICILLIN >=32 RESISTANT Resistant     CEFAZOLIN >=64 RESISTANT Resistant     CEFTRIAXONE >=64 RESISTANT Resistant     CIPROFLOXACIN <=0.25 SENSITIVE Sensitive     GENTAMICIN <=1 SENSITIVE Sensitive     IMIPENEM <=0.25 SENSITIVE Sensitive     NITROFURANTOIN 64 INTERMEDIATE Intermediate     TRIMETH/SULFA >=320 RESISTANT Resistant     AMPICILLIN/SULBACTAM 4 SENSITIVE Sensitive     PIP/TAZO <=4 SENSITIVE Sensitive     * >=100,000 COLONIES/mL ESCHERICHIA COLI     Studies: Dg Chest 2 View  03/22/2015  CLINICAL DATA:  Fever to  102 degrees, cough for 3 months, LEFT side chest pain, RIGHT-sided  chest tightness smoker, rib pain EXAM: CHEST  2 VIEW COMPARISON:  06/26/2012 FINDINGS: Normal heart size, mediastinal contours, and pulmonary vascularity. Moderate bronchitic changes new since previous exam. Subsegmental atelectasis at base of lingula, new. No definite pulmonary infiltrate, pleural effusion or pneumothorax. Bones unremarkable. IMPRESSION: Moderate bronchitic changes with subsegmental atelectasis at lingula new since prior exam. Electronically Signed   By: Ulyses Southward M.D.   On: 03/22/2015 15:59   Ct Angio Chest Pe W/cm &/or Wo Cm  03/22/2015  CLINICAL DATA:  Fever and cough beginning yesterday. Initial encounter. EXAM: CT ANGIOGRAPHY CHEST WITH CONTRAST TECHNIQUE: Multidetector CT imaging of the chest was performed using the standard protocol during bolus administration of intravenous contrast. Multiplanar CT image reconstructions and MIPs were obtained to evaluate the vascular anatomy. CONTRAST:  100 mL OMNIPAQUE IOHEXOL 350 MG/ML SOLN COMPARISON:  PA and lateral chest earlier today and single view of the chest 06/26/2012. FINDINGS: No pulmonary embolus is identified. No axillary, hilar or mediastinal lymphadenopathy is seen. The patient has a small right pleural effusion which appears partially loculated in the right paravertebral space. Trace amount of pleural fluid is seen on the left. Heart size is normal. There is no pericardial effusion. The lungs demonstrate emphysematous change. There is some dependent atelectasis and mild atelectasis in the lingula. No consolidative process, nodule or mass is identified. Visualized upper abdomen demonstrates a small amount of fluid about the left kidney but no organized collection. No focal bony abnormality is identified. Review of the MIP images confirms the above findings. IMPRESSION: Negative for pulmonary embolus. Small right pleural effusion appears partially loculated in the right  paravertebral space. Trace left pleural effusion is noted. No consolidative process is identified with mild dependent atelectasis in the subsegmental atelectasis in lingula. Emphysema. Electronically Signed   By: Drusilla Kanner M.D.   On: 03/22/2015 21:44   Ct Renal Stone Study  03/22/2015  CLINICAL DATA:  Abdominal pain and back pain for 3 months getting worse, right flank pain EXAM: CT ABDOMEN AND PELVIS WITHOUT CONTRAST TECHNIQUE: Multidetector CT imaging of the abdomen and pelvis was performed following the standard protocol without IV contrast. COMPARISON:  None. FINDINGS: Lower chest: Tiny pleural effusions. Partially visualized thick walled pleural-based mass with low-attenuation center extending from the sub carinal region to the extreme inferior right lung base. Hepatobiliary: Negative Pancreas: Negative Spleen: Negative Adrenals/Urinary Tract: Adrenal glands are normal. There is moderate bilateral perinephric inflammation. No renal or ureteral stones. No hydronephrosis. Bladder appears normal. Stomach/Bowel: Tiny volume of oral contrast seen in the stomach. Nonobstructive gas pattern. Stomach small bowel and appendix are normal. Large bowel is normal. Vascular/Lymphatic: Mild aortoiliac calcification. 12 mm loop periaortic lymph node at the level of the left renal hilum. Reproductive: Negative Other: Suspect small volume free fluid in the pelvis although difficult to distinguish from adjacent non-opacified loops of bowel. Musculoskeletal: No acute findings IMPRESSION: 1. Bilateral perinephric inflammation without hydronephrosis. Bilateral pyelonephritis is 1 consideration. Systemic active renal disease is another consideration. 2. Pleural-based thick walled mass medial right lung base only partially visualized. This requires further evaluation. Recommend contrast-enhanced CT chest abdomen and pelvis if the patient is a candidate. Electronically Signed   By: Esperanza Heir M.D.   On: 03/22/2015 18:58     Scheduled Meds:  Scheduled Meds: . diclofenac sodium  4 g Topical QID  . ertapenem  1 g Intravenous Q24H  . guaiFENesin  600 mg Oral BID  . sodium chloride flush  3 mL Intravenous Q12H  Continuous Infusions:   Time spent on care of this patient: 35 min   Annarae Macnair, MD 03/24/2015, 11:40 AM  LOS: 2 days   [facility] Office  [phone] Pager - Text Page per [email or web address] If 7PM-7AM, please contact night-coverage [email or web address]

## 2015-03-24 NOTE — Progress Notes (Signed)
  CM received consult:Need IV antibiotics (invanz) x 13 days. Pt with h/o IV drug use and reports being homeless. CM made referral to CSW  for  Placement. CM to f/u with disposition needs. Gae Gallop RN,BSN,CM [phone]

## 2015-03-25 ENCOUNTER — Inpatient Hospital Stay: Payer: Self-pay

## 2015-03-25 LAB — CBC
HEMATOCRIT: 24.4 % — AB (ref 36.0–46.0)
Hemoglobin: 8 g/dL — ABNORMAL LOW (ref 12.0–15.0)
MCH: 27.7 pg (ref 26.0–34.0)
MCHC: 32.8 g/dL (ref 30.0–36.0)
MCV: 84.4 fL (ref 78.0–100.0)
PLATELETS: 361 10*3/uL (ref 150–400)
RBC: 2.89 MIL/uL — AB (ref 3.87–5.11)
RDW: 15.4 % (ref 11.5–15.5)
WBC: 8.7 10*3/uL (ref 4.0–10.5)

## 2015-03-25 LAB — RAPID URINE DRUG SCREEN, HOSP PERFORMED
Amphetamines: NOT DETECTED
BARBITURATES: NOT DETECTED
Benzodiazepines: NOT DETECTED
COCAINE: NOT DETECTED
OPIATES: POSITIVE — AB
TETRAHYDROCANNABINOL: NOT DETECTED

## 2015-03-25 MED ORDER — SODIUM CHLORIDE 0.9 % IV BOLUS (SEPSIS)
1000.0000 mL | Freq: Once | INTRAVENOUS | Status: AC
  Administered 2015-03-25: 1000 mL via INTRAVENOUS

## 2015-03-25 MED ORDER — LORAZEPAM 2 MG/ML IJ SOLN
1.0000 mg | Freq: Once | INTRAMUSCULAR | Status: AC
  Administered 2015-03-26: 1 mg via INTRAVENOUS
  Filled 2015-03-25: qty 1

## 2015-03-25 NOTE — Progress Notes (Signed)
MD spoke with CM regarding d/c planning for pt . MD stated pt will d/c today to home and pt will need home health iv infusion therapy(antibiotics). CM mention pt's hx of iv drug use and MD stated not an issue for pt today and pt should be ok going home with IV home infusion. CM called [facility] and made referral with Lupita Leash for [facility] and Pam for IV infusion, charity case 2/2 no insurance. CM to f/u with disposition needs. Gae Gallop RN,BSN,CM [phone]

## 2015-03-25 NOTE — Progress Notes (Signed)
CM spoke with pt about d/c and plan is to d/c to home with home health. Pt informed CM she will be living with friend Earlie Server [phone]), address: [address] [place], [place] [address]. Pt's contact # [phone]. Gae Gallop RN,BSN,CM [phone]

## 2015-03-25 NOTE — Progress Notes (Signed)
[facility] Progress Note   Chelsea Blake  ZOX:[id number]  DOB: 12/15/1970  DOA: 03/22/2015 PCP: No PCP Per Patient  Brief narrative: Chelsea Blake is a 45 y.o. female with UTI recently treated with Bactrim presenting with b/l flank pain and fever. Found to have b/l Pyelonephritis and incedental small loculated fluid collection near the right lung/ vertebral area.    Subjective: Lumbar pain continues. No more fevers.  Assessment/Plan: Principal Problem:   Pyelonephritis with sepsis - continues to have flank pain  - culture results today reveal E coli- ESBL-   changed to Invanz - plan for PICC and 2 wks or antibiotics- d/w ID  Active Problems:   Hypokalemia - replaced  Chest pain- right sided - very mild elevated in Troponin= follow - no h/o CAD - will order Voltaren gel for pain   Smoker - cont Nicotine patch- advised to stop smoking    Loculated pleural effusion - small amount of fluid-discussed with IR no urgency to drain this -  treat above and follow fluid collection with repeat CT in 1 wk    History of intravenous drug use in remission    Breast nodule - need outpt follow up    Antibiotics: Anti-infectives    Start     Dose/Rate Route Frequency Ordered Stop   03/24/15 1030  ertapenem (INVANZ) 1 g in sodium chloride 0.9 % 50 mL IVPB     1 g 100 mL/hr over 30 Minutes Intravenous Every 24 hours 03/24/15 0923     03/24/15 0900  Ampicillin-Sulbactam (UNASYN) 3 g in sodium chloride 0.9 % 100 mL IVPB  Status:  Discontinued     3 g 100 mL/hr over 60 Minutes Intravenous Every 6 hours 03/24/15 0858 03/24/15 0924   03/23/15 1000  cefTRIAXone (ROCEPHIN) 1 g in dextrose 5 % 50 mL IVPB  Status:  Discontinued     1 g 100 mL/hr over 30 Minutes Intravenous Daily 03/23/15 0826 03/24/15 0858   03/22/15 2359  piperacillin-tazobactam (ZOSYN) IVPB 3.375 g  Status:  Discontinued     3.375 g 12.5 mL/hr over 240 Minutes Intravenous 3 times per day 03/22/15 2317  03/23/15 0826   03/22/15 1700  cefTRIAXone (ROCEPHIN) 1 g in dextrose 5 % 50 mL IVPB     1 g 100 mL/hr over 30 Minutes Intravenous  Once 03/22/15 1653 03/22/15 1810     Code Status:     Code Status Orders        Start     Ordered   03/22/15 2318  Full code   Continuous     03/22/15 2317    Code Status History    Date Active Date Inactive Code Status Order ID Comments User Context   This patient has a current code status but no historical code status.     Family Communication:   Disposition Plan: home when stable DVT prophylaxis: Lovenox Consultants:  IR Procedures:  none    Objective: Filed Weights   03/22/15 2310  Weight: 56.5 kg (124 lb 9 oz)    Intake/Output Summary (Last 24 hours) at 03/25/15 1730 Last data filed at 03/25/15 1327  Gross per 24 hour  Intake    582 ml  Output      0 ml  Net    582 ml     Vitals Filed Vitals:   03/24/15 1447 03/24/15 2145 03/25/15 0513 03/25/15 1416  BP: 130/91 117/85 125/81 124/77  Pulse: 100 108 88 88  Temp: 98.6  F (37 C) 98.7 F (37.1 C) 98.3 F (36.8 C) 98.3 F (36.8 C)  TempSrc: Oral Oral Oral Oral  Resp: Height:      Weight:      SpO2: 92% 93% 90% 92%    Exam:  General:  Pt is alert, not in acute distress  HEENT: No icterus, No thrush, oral mucosa moist  Cardiovascular: regular rate and rhythm, S1/S2 No murmur  Respiratory: clear to auscultation bilaterally   Abdomen: Soft, +Bowel sounds, non tender, non distended, no guarding- b/l flank tenderness   MSK: No cyanosis or clubbing- no pedal edema   Data Reviewed: Basic Metabolic Panel:  Recent Labs Lab 03/22/15 1536 03/23/15 0019 03/23/15 0626 03/24/15 0630  NA 137 137 138 137  K 2.7* 3.6 3.2* 3.5  CL 98* 104 103 104  CO2 GLUCOSE 126* 119* 99 99  BUN 6 6 <5* <5*  CREATININE 0.81 0.75 0.73 0.57  CALCIUM 8.6* 7.7* 7.6* 7.8*  MG 1.9  --  1.6* 1.6*  PHOS  --   --  3.0  --    Liver Function Tests:  Recent  Labs Lab 03/22/15 1536 03/23/15 0019 03/23/15 0626  AST 13* 14* 12*  ALT 10* 10* 11*  ALKPHOS 100 83 92  BILITOT 0.8 0.3 0.8  PROT 7.3 6.1* 6.2*  ALBUMIN 2.5* 2.0* 1.9*    Recent Labs Lab 03/22/15 1536  LIPASE 19   No results for input(s): AMMONIA in the last 168 hours. CBC:  Recent Labs Lab 03/22/15 1536 03/23/15 0019 03/23/15 0626 03/24/15 0630 03/25/15 0533  WBC 31.3* 19.8* 21.5* 12.8* 8.7  NEUTROABS  --  17.0*  --   --   --   HGB 10.6* 8.5* 8.8* 8.2* 8.0*  HCT 32.0* 26.9* 26.9* 26.1* 24.4*  MCV 84.4 85.1 85.4 85.0 84.4  PLT 445* 393 359 367 361   Cardiac Enzymes:  Recent Labs Lab 03/23/15 0626 03/23/15 1125 03/23/15 1650  TROPONINI 0.37* 0.64* 0.13*   BNP (last 3 results) No results for input(s): BNP in the last 8760 hours.  ProBNP (last 3 results) No results for input(s): PROBNP in the last 8760 hours.  CBG: No results for input(s): GLUCAP in the last 168 hours.  Recent Results (from the past 240 hour(s))  Urine culture     Status: None   Collection Time: 03/22/15  3:34 PM  Result Value Ref Range Status   Specimen Description URINE, RANDOM  Final   Special Requests NONE  Final   Culture   Final    >=100,000 COLONIES/mL ESCHERICHIA COLI Confirmed Extended Spectrum Beta-Lactamase Producer (ESBL)    Report Status 03/24/2015 FINAL  Final   Organism ID, Bacteria ESCHERICHIA COLI  Final      Susceptibility   Escherichia coli - MIC*    AMPICILLIN >=32 RESISTANT Resistant     CEFAZOLIN >=64 RESISTANT Resistant     CEFTRIAXONE >=64 RESISTANT Resistant     CIPROFLOXACIN <=0.25 SENSITIVE Sensitive     GENTAMICIN <=1 SENSITIVE Sensitive     IMIPENEM <=0.25 SENSITIVE Sensitive     NITROFURANTOIN 64 INTERMEDIATE Intermediate     TRIMETH/SULFA >=320 RESISTANT Resistant     AMPICILLIN/SULBACTAM 4 SENSITIVE Sensitive     PIP/TAZO <=4 SENSITIVE Sensitive     * >=100,000 COLONIES/mL ESCHERICHIA COLI  Culture, blood (x 2)     Status: None (Preliminary  result)   Collection Time: 03/23/15  1:15 AM  Result Value Ref  Range Status   Specimen Description BLOOD RIGHT ANTECUBITAL  Final   Special Requests IN PEDIATRIC BOTTLE  Final   Culture NO GROWTH 2 DAYS  Final   Report Status PENDING  Incomplete  Culture, blood (x 2)     Status: None (Preliminary result)   Collection Time: 03/23/15  1:20 AM  Result Value Ref Range Status   Specimen Description BLOOD LEFT ANTECUBITAL  Final   Special Requests IN PEDIATRIC BOTTLE  Final   Culture NO GROWTH 2 DAYS  Final   Report Status PENDING  Incomplete     Studies: No results found.  Scheduled Meds:  Scheduled Meds: . diclofenac sodium  4 g Topical QID  . ertapenem  1 g Intravenous Q24H  . guaiFENesin  600 mg Oral BID  . LORazepam  1 mg Intravenous Once  . sodium chloride flush  3 mL Intravenous Q12H   Continuous Infusions:   Time spent on care of this patient: 35 min   Tanayah Squitieri, MD 03/25/2015, 5:30 PM  LOS: 3 days   [facility] Office  [phone] Pager - Text Page per [email or web address] If 7PM-7AM, please contact night-coverage [email or web address]

## 2015-03-25 NOTE — Progress Notes (Signed)
Patient was supposed to have Picc line placed today for IV home antibiotic therapy. IV team came to place picc, attempted, but was unsuccessful due to Pt becoming extremely anxious. Pt was consoled, agreed to have them try again if given Ativan. However, IV team said they did not think they would be successful with or without Ativan, they recommended Pt go to IR. Dr Butler Denmark was paged and notified of this, IR was contacted but they said there are no openings for the Pt today. Pt then said that she wanted to do the daily route of injections. Case Management and MD notified of this, however [facility] could not get [facility] because they had left early for the day. CM tried many different numbers and locations to get Pt in for daily antibiotic therapy with no success. IR was contacted but they said it was after hours and would only help if it was an emergency. Higher management involved. They contacted Dr Miles Costain with IR. Dr Miles Costain stated that he has emergency case at 9:00am and will place this Pt's picc after the 9:00am case. Pt is scheduled for picc placement Saturday morning, January 28. Management aware.

## 2015-03-25 NOTE — Progress Notes (Signed)
Attempted to place PICC unsuccessful.  Each time attempt to thread guide wire vein constricted.  Pt.  Very anxious during procedure and not able to tolerate sterile drape  over face.  Attempted to do procedure with mask but patient anxious with mask also.   Patient became increasingly anxious to the point that I had to stop the procedure.  If PICC still desired may consider IR.

## 2015-03-26 ENCOUNTER — Inpatient Hospital Stay (HOSPITAL_COMMUNITY): Payer: Self-pay

## 2015-03-26 DIAGNOSIS — N63 Unspecified lump in breast: Secondary | ICD-10-CM

## 2015-03-26 DIAGNOSIS — R079 Chest pain, unspecified: Secondary | ICD-10-CM

## 2015-03-26 MED ORDER — LIDOCAINE HCL 1 % IJ SOLN
INTRAMUSCULAR | Status: AC
  Filled 2015-03-26: qty 20

## 2015-03-26 MED ORDER — HEPARIN SOD (PORK) LOCK FLUSH 100 UNIT/ML IV SOLN
INTRAVENOUS | Status: AC
  Filled 2015-03-26: qty 5

## 2015-03-26 MED ORDER — SODIUM CHLORIDE 0.9 % IV SOLN: 1.0000 g | INTRAVENOUS | Status: DC

## 2015-03-26 MED ORDER — DICLOFENAC SODIUM 1 % TD GEL: 4.0000 g | Freq: Four times a day (QID) | TRANSDERMAL | Status: DC

## 2015-03-26 MED ORDER — KETOROLAC TROMETHAMINE 30 MG/ML IJ SOLN
30.0000 mg | Freq: Once | INTRAMUSCULAR | Status: AC
  Administered 2015-03-26: 30 mg via INTRAVENOUS
  Filled 2015-03-26: qty 1

## 2015-03-26 NOTE — Progress Notes (Signed)
Pt requesting help to get to her follow up appointments.  Talked with CSW and she suggested [facility] application.  Application obtained and given to patient to fill out to get help with transportation.

## 2015-03-26 NOTE — Discharge Summary (Signed)
Physician Discharge Summary  Chelsea Blake GNF:[id number] DOB: May 21, 1970 DOA: 03/22/2015  PCP: No PCP Per Patient  Admit date: 03/22/2015 Discharge date: 03/26/2015  Time spent: 50 minutes  Recommendations for Outpatient Follow-up:  1. Home health to remove PICC after course of Invanz complete 2. Need CT of chest in 2 wks to assess small fluid collection 3. Has appt to f/u at [facility] for Internal medicine f/u  Discharge Condition: stable    Discharge Diagnoses:  Principal Problem:   Pyelonephritis Active Problems:   Sepsis (HCC)   Hypokalemia   Loculated pleural effusion   History of intravenous drug use in remission   Breast nodule   History of present illness:  Chelsea Blake is a 45 y.o. female with UTI recently treated with Bactrim presenting with b/l flank pain and fever. Found to have b/l Pyelonephritis and incedental small loculated fluid collection near the right lung/ vertebral area.   Hospital Course:  Principal Problem:  Pyelonephritis with sepsis - sepsis resolved -Urine culture results reveal E coli- ESBL- changed to Invanz - have placed PICC and have planned for a total of 2 wks of antibiotics- d/w ID - Due to history of IV drug abuse (which she states to have stopped over a year ago), have explained to the patient in detail in the presence of her husband that the PICC line ends directly in her heart and it should not be used for anything other than the prescribed antibiotics. She has been explained the complications of using this line for IV drugs which include infections, MI and death. She voices understanding of this. UDS is negative other thanfor Opiates which she has been receiving in the hospital.   Active Problems:  Hypokalemia - replaced  Chest pain- right sided - very mild elevated in Troponin- non-specific - no h/o CAD - improve with using Voltaren gel    Smoker - she states she does not need a Nicotine patch-  advised to stop smoking   Loculated pleural effusion - small amount of fluid-discussed with IR no urgency to drain this - treat above and follow fluid collection with repeat CT in 1 wk   History of intravenous drug use in remission   Breast nodule - needs outpt follow up     Procedures:  PICC line (i.e. Studies not automatically included, echos, thoracentesis, etc; not x-rays)  Consultations:  IR  Discharge Exam: Filed Weights   03/22/15 2310  Weight: 56.5 kg (124 lb 9 oz)   Filed Vitals:   03/25/15 2144 03/26/15 0619  BP: 133/83 122/71  Pulse: 95 90  Temp: 99.8 F (37.7 C) 97.4 F (36.3 C)  Resp: 20 18    General: AAO x 3, no distress Cardiovascular: RRR, no murmurs  Respiratory: clear to auscultation bilaterally GI: soft, non-tender, non-distended, bowel sound positive  Discharge Instructions You were cared for by a hospitalist during your hospital stay. If you have any questions about your discharge medications or the care you received while you were in the hospital after you are discharged, you can call the unit and asked to speak with the hospitalist on call if the hospitalist that took care of you is not available. Once you are discharged, your primary care physician will handle any further medical issues. Please note that NO REFILLS for any discharge medications will be authorized once you are discharged, as it is imperative that you return to your primary care physician (or establish a relationship with a primary  care physician if you do not have one) for your aftercare needs so that they can reassess your need for medications and monitor your lab values.      Discharge Instructions    Diet - low sodium heart healthy    Complete by:  As directed      Increase activity slowly    Complete by:  As directed             Medication List    TAKE these medications        diclofenac sodium 1 % Gel  Commonly known as:  VOLTAREN  Apply 4 g topically 4  (four) times daily.     ertapenem 1 g in sodium chloride 0.9 % 50 mL  Inject 1 g into the vein daily.     ibuprofen 200 MG tablet  Commonly known as:  ADVIL,MOTRIN  Take 800 mg by mouth every 4 (four) hours as needed (pain/swelling).       No Known Allergies Follow-up Information    Follow up with [facility] On 04/25/2015.   Why:  Follow up appointment schedule for 04/25/2015 at 2:30pm   Contact information:   [address] [place] [address]       Follow up with [facility].   Why:   Home Health RN arranged for iv infusion   Contact information:   [address] [place] [address] [phone]       Follow up with [facility].   Why:  iv infusion pump arranged   Contact information:   [address] [place] [address] [phone]        The results of significant diagnostics from this hospitalization (including imaging, microbiology, ancillary and laboratory) are listed below for reference.    Significant Diagnostic Studies: Dg Chest 2 View  03/22/2015  CLINICAL DATA:  Fever to 102 degrees, cough for 3 months, LEFT side chest pain, RIGHT-sided chest tightness smoker, rib pain EXAM: CHEST  2 VIEW COMPARISON:  06/26/2012 FINDINGS: Normal heart size, mediastinal contours, and pulmonary vascularity. Moderate bronchitic changes new since previous exam. Subsegmental atelectasis at base of lingula, new. No definite pulmonary infiltrate, pleural effusion or pneumothorax. Bones unremarkable. IMPRESSION: Moderate bronchitic changes with subsegmental atelectasis at lingula new since prior exam. Electronically Signed   By: Ulyses Southward M.D.   On: 03/22/2015 15:59   Ct Angio Chest Pe W/cm &/or Wo Cm  03/22/2015  CLINICAL DATA:  Fever and cough beginning yesterday. Initial encounter. EXAM: CT ANGIOGRAPHY CHEST WITH CONTRAST TECHNIQUE: Multidetector CT imaging of the chest was performed using the  standard protocol during bolus administration of intravenous contrast. Multiplanar CT image reconstructions and MIPs were obtained to evaluate the vascular anatomy. CONTRAST:  100 mL OMNIPAQUE IOHEXOL 350 MG/ML SOLN COMPARISON:  PA and lateral chest earlier today and single view of the chest 06/26/2012. FINDINGS: No pulmonary embolus is identified. No axillary, hilar or mediastinal lymphadenopathy is seen. The patient has a small right pleural effusion which appears partially loculated in the right paravertebral space. Trace amount of pleural fluid is seen on the left. Heart size is normal. There is no pericardial effusion. The lungs demonstrate emphysematous change. There is some dependent atelectasis and mild atelectasis in the lingula. No consolidative process, nodule or mass is identified. Visualized upper abdomen demonstrates a small amount of fluid about the left kidney but no organized collection. No focal bony abnormality is identified.  Review of the MIP images confirms the above findings. IMPRESSION: Negative for pulmonary embolus. Small right pleural effusion appears partially loculated in the right paravertebral space. Trace left pleural effusion is noted. No consolidative process is identified with mild dependent atelectasis in the subsegmental atelectasis in lingula. Emphysema. Electronically Signed   By: Drusilla Kanner M.D.   On: 03/22/2015 21:44   Ct Renal Stone Study  03/22/2015  CLINICAL DATA:  Abdominal pain and back pain for 3 months getting worse, right flank pain EXAM: CT ABDOMEN AND PELVIS WITHOUT CONTRAST TECHNIQUE: Multidetector CT imaging of the abdomen and pelvis was performed following the standard protocol without IV contrast. COMPARISON:  None. FINDINGS: Lower chest: Tiny pleural effusions. Partially visualized thick walled pleural-based mass with low-attenuation center extending from the sub carinal region to the extreme inferior right lung base. Hepatobiliary: Negative Pancreas:  Negative Spleen: Negative Adrenals/Urinary Tract: Adrenal glands are normal. There is moderate bilateral perinephric inflammation. No renal or ureteral stones. No hydronephrosis. Bladder appears normal. Stomach/Bowel: Tiny volume of oral contrast seen in the stomach. Nonobstructive gas pattern. Stomach small bowel and appendix are normal. Large bowel is normal. Vascular/Lymphatic: Mild aortoiliac calcification. 12 mm loop periaortic lymph node at the level of the left renal hilum. Reproductive: Negative Other: Suspect small volume free fluid in the pelvis although difficult to distinguish from adjacent non-opacified loops of bowel. Musculoskeletal: No acute findings IMPRESSION: 1. Bilateral perinephric inflammation without hydronephrosis. Bilateral pyelonephritis is 1 consideration. Systemic active renal disease is another consideration. 2. Pleural-based thick walled mass medial right lung base only partially visualized. This requires further evaluation. Recommend contrast-enhanced CT chest abdomen and pelvis if the patient is a candidate. Electronically Signed   By: Esperanza Heir M.D.   On: 03/22/2015 18:58    Microbiology: Recent Results (from the past 240 hour(s))  Urine culture     Status: None   Collection Time: 03/22/15  3:34 PM  Result Value Ref Range Status   Specimen Description URINE, RANDOM  Final   Special Requests NONE  Final   Culture   Final    >=100,000 COLONIES/mL ESCHERICHIA COLI Confirmed Extended Spectrum Beta-Lactamase Producer (ESBL)    Report Status 03/24/2015 FINAL  Final   Organism ID, Bacteria ESCHERICHIA COLI  Final      Susceptibility   Escherichia coli - MIC*    AMPICILLIN >=32 RESISTANT Resistant     CEFAZOLIN >=64 RESISTANT Resistant     CEFTRIAXONE >=64 RESISTANT Resistant     CIPROFLOXACIN <=0.25 SENSITIVE Sensitive     GENTAMICIN <=1 SENSITIVE Sensitive     IMIPENEM <=0.25 SENSITIVE Sensitive     NITROFURANTOIN 64 INTERMEDIATE Intermediate      TRIMETH/SULFA >=320 RESISTANT Resistant     AMPICILLIN/SULBACTAM 4 SENSITIVE Sensitive     PIP/TAZO <=4 SENSITIVE Sensitive     * >=100,000 COLONIES/mL ESCHERICHIA COLI  Culture, blood (x 2)     Status: None (Preliminary result)   Collection Time: 03/23/15  1:15 AM  Result Value Ref Range Status   Specimen Description BLOOD RIGHT ANTECUBITAL  Final   Special Requests IN PEDIATRIC BOTTLE  Final   Culture NO GROWTH 2 DAYS  Final   Report Status PENDING  Incomplete  Culture, blood (x 2)     Status: None (Preliminary result)   Collection Time: 03/23/15  1:20 AM  Result Value Ref Range Status   Specimen Description BLOOD LEFT ANTECUBITAL  Final   Special Requests IN PEDIATRIC BOTTLE  Final   Culture NO  GROWTH 2 DAYS  Final   Report Status PENDING  Incomplete     Labs: Basic Metabolic Panel:  Recent Labs Lab 03/22/15 1536 03/23/15 0019 03/23/15 0626 03/24/15 0630  NA 137 137 138 137  K 2.7* 3.6 3.2* 3.5  CL 98* 104 103 104  CO2 29 24 26 29   GLUCOSE 126* 119* 99 99  BUN 6 6 <5* <5*  CREATININE 0.81 0.75 0.73 0.57  CALCIUM 8.6* 7.7* 7.6* 7.8*  MG 1.9  --  1.6* 1.6*  PHOS  --   --  3.0  --    Liver Function Tests:  Recent Labs Lab 03/22/15 1536 03/23/15 0019 03/23/15 0626  AST 13* 14* 12*  ALT 10* 10* 11*  ALKPHOS 100 83 92  BILITOT 0.8 0.3 0.8  PROT 7.3 6.1* 6.2*  ALBUMIN 2.5* 2.0* 1.9*    Recent Labs Lab 03/22/15 1536  LIPASE 19   No results for input(s): AMMONIA in the last 168 hours. CBC:  Recent Labs Lab 03/22/15 1536 03/23/15 0019 03/23/15 0626 03/24/15 0630 03/25/15 0533  WBC 31.3* 19.8* 21.5* 12.8* 8.7  NEUTROABS  --  17.0*  --   --   --   HGB 10.6* 8.5* 8.8* 8.2* 8.0*  HCT 32.0* 26.9* 26.9* 26.1* 24.4*  MCV 84.4 85.1 85.4 85.0 84.4  PLT 445* 393 359 367 361   Cardiac Enzymes:  Recent Labs Lab 03/23/15 0626 03/23/15 1125 03/23/15 1650  TROPONINI 0.37* 0.64* 0.13*   BNP: BNP (last 3 results) No results for input(s): BNP in  the last 8760 hours.  ProBNP (last 3 results) No results for input(s): PROBNP in the last 8760 hours.  CBG: No results for input(s): GLUCAP in the last 168 hours.     SignedCalvert Cantor, MD [facility] 03/26/2015, 10:43 AM

## 2015-03-26 NOTE — Progress Notes (Signed)
Chelsea Blake to be D/C'd Home per MD order.  Discussed with the patient and all questions fully answered.  VSS, Skin clean, dry and intact without evidence of skin break down, no evidence of skin tears noted. IV catheter discontinued intact. Site without signs and symptoms of complications. Dressing and pressure applied.  An After Visit Summary was printed and given to the patient. Patient received prescription.  D/c education completed with patient/family including follow up instructions, medication list, d/c activities limitations if indicated, with other d/c instructions as indicated by MD - patient able to verbalize understanding, all questions fully answered.   Patient instructed to return to ED, call 911, or call MD for any changes in condition.   Patient escorted via WC, and D/C home via private auto.  Pura Spice 03/26/2015 12:18 PM

## 2015-03-26 NOTE — Progress Notes (Signed)
[facility]  Patient Status: [facility] for [facility] this admission  [facility] is providing the following services:HHRN and [facility] for home IV ABX.  [facility] is set to provide [facility] services as listed for Ms. Chelsea Blake.  In hospital teaching regarding IV ABX set up and administration provided with patients boyfriend, Caryn Bee, last PM. He did very well.  He has administered IV ABX for family thru [facility] in the last year.  I had long discussion with pt and Caryn Bee regarding H/O possible IV drug use > 1 year.  Advised pt [facility] will support her home IV ABX but pt must commit to the following: Pt must be homebound and be at home for all scheduled RN visits [facility] home care staff will assess pt clinical status and IV access each visit for possible misuse. If PICC has been found to misused or pt assessed to be under influence of other non MD prescribed meds, pt will be discharged from [facility] services.  Pt and boyfriend assure that she will not and verbalized understanding.   NOTE:  [facility] still needs IV Invanz prescription before DC to home later today.  Thanks you!   If patient discharges after hours, please call [phone].   [name] 03/26/2015, 9:07 AM

## 2015-03-26 NOTE — Procedures (Signed)
S/p LUE SL POWER PICC TIP SVC/RA NO COMP STABLE READY FOR USE FULL REPORT IN PACS 38CM

## 2015-03-26 NOTE — Progress Notes (Signed)
CSW consulted regarding transportation issues. Patient reported having no income and no money to get home. She reported that her boyfriend does not have a job either. CSW provided patient with bus pass and transportation resources.   CSW signing off.  Osborne Casco Hesper Venturella LCSWA [phone]

## 2015-03-28 LAB — CULTURE, BLOOD (ROUTINE X 2)
Culture: NO GROWTH
Culture: NO GROWTH

## 2015-04-07 ENCOUNTER — Emergency Department (HOSPITAL_COMMUNITY)
Admission: EM | Admit: 2015-04-07 | Discharge: 2015-04-07 | Disposition: A | Discharge status: Home / Self Care | Payer: Self-pay | Attending: Emergency Medicine | Admitting: Emergency Medicine

## 2015-04-07 ENCOUNTER — Encounter (HOSPITAL_COMMUNITY): Payer: Self-pay | Admitting: *Deleted

## 2015-04-07 ENCOUNTER — Telehealth: Payer: Self-pay | Admitting: *Deleted

## 2015-04-07 DIAGNOSIS — F14129 Cocaine abuse with intoxication, unspecified: Secondary | ICD-10-CM | POA: Insufficient documentation

## 2015-04-07 DIAGNOSIS — Z79899 Other long term (current) drug therapy: Secondary | ICD-10-CM | POA: Insufficient documentation

## 2015-04-07 DIAGNOSIS — F11129 Opioid abuse with intoxication, unspecified: Secondary | ICD-10-CM | POA: Insufficient documentation

## 2015-04-07 DIAGNOSIS — R1110 Vomiting, unspecified: Secondary | ICD-10-CM | POA: Insufficient documentation

## 2015-04-07 DIAGNOSIS — Z87448 Personal history of other diseases of urinary system: Secondary | ICD-10-CM | POA: Insufficient documentation

## 2015-04-07 DIAGNOSIS — F17210 Nicotine dependence, cigarettes, uncomplicated: Secondary | ICD-10-CM | POA: Insufficient documentation

## 2015-04-07 DIAGNOSIS — Z791 Long term (current) use of non-steroidal anti-inflammatories (NSAID): Secondary | ICD-10-CM | POA: Insufficient documentation

## 2015-04-07 DIAGNOSIS — F1910 Other psychoactive substance abuse, uncomplicated: Secondary | ICD-10-CM

## 2015-04-07 DIAGNOSIS — R1030 Lower abdominal pain, unspecified: Secondary | ICD-10-CM | POA: Insufficient documentation

## 2015-04-07 LAB — CBC WITH DIFFERENTIAL/PLATELET
BASOS PCT: 0 %
Basophils Absolute: 0 10*3/uL (ref 0.0–0.1)
EOS ABS: 0.1 10*3/uL (ref 0.0–0.7)
EOS PCT: 1 %
HCT: 28.1 % — ABNORMAL LOW (ref 36.0–46.0)
Hemoglobin: 8.8 g/dL — ABNORMAL LOW (ref 12.0–15.0)
LYMPHS ABS: 1.6 10*3/uL (ref 0.7–4.0)
Lymphocytes Relative: 18 %
MCH: 28.6 pg (ref 26.0–34.0)
MCHC: 31.3 g/dL (ref 30.0–36.0)
MCV: 91.2 fL (ref 78.0–100.0)
Monocytes Absolute: 1 10*3/uL (ref 0.1–1.0)
Monocytes Relative: 11 %
Neutro Abs: 6 10*3/uL (ref 1.7–7.7)
Neutrophils Relative %: 70 %
PLATELETS: 473 10*3/uL — AB (ref 150–400)
RBC: 3.08 MIL/uL — AB (ref 3.87–5.11)
RDW: 18.9 % — ABNORMAL HIGH (ref 11.5–15.5)
WBC: 8.7 10*3/uL (ref 4.0–10.5)

## 2015-04-07 LAB — RAPID URINE DRUG SCREEN, HOSP PERFORMED
AMPHETAMINES: NOT DETECTED
Barbiturates: NOT DETECTED
Benzodiazepines: NOT DETECTED
Cocaine: POSITIVE — AB
Opiates: POSITIVE — AB
Tetrahydrocannabinol: NOT DETECTED

## 2015-04-07 LAB — COMPREHENSIVE METABOLIC PANEL
ALBUMIN: 3.2 g/dL — AB (ref 3.5–5.0)
ALT: 16 U/L (ref 14–54)
ANION GAP: 8 (ref 5–15)
AST: 23 U/L (ref 15–41)
Alkaline Phosphatase: 64 U/L (ref 38–126)
BUN: 15 mg/dL (ref 6–20)
CHLORIDE: 102 mmol/L (ref 101–111)
CO2: 26 mmol/L (ref 22–32)
CREATININE: 0.59 mg/dL (ref 0.44–1.00)
Calcium: 8.5 mg/dL — ABNORMAL LOW (ref 8.9–10.3)
GFR calc non Af Amer: >60 mL/min (ref >60)
Glucose, Bld: 97 mg/dL (ref 65–99)
Potassium: 3.7 mmol/L (ref 3.5–5.1)
SODIUM: 136 mmol/L (ref 135–145)
Total Bilirubin: 0.4 mg/dL (ref 0.3–1.2)
Total Protein: 7.4 g/dL (ref 6.5–8.1)

## 2015-04-07 LAB — ETHANOL

## 2015-04-07 LAB — URINE MICROSCOPIC-ADD ON

## 2015-04-07 LAB — URINALYSIS, ROUTINE W REFLEX MICROSCOPIC
Bilirubin Urine: NEGATIVE
Glucose, UA: NEGATIVE mg/dL
Ketones, ur: 15 mg/dL — AB
NITRITE: NEGATIVE
PH: 5.5 (ref 5.0–8.0)
PROTEIN: 30 mg/dL — AB
SPECIFIC GRAVITY, URINE: 1.024 (ref 1.005–1.030)

## 2015-04-07 MED ORDER — NALOXONE HCL 0.4 MG/ML IJ SOLN
0.4000 mg | Freq: Once | INTRAMUSCULAR | Status: AC
  Administered 2015-04-07: 0.4 mg via INTRAVENOUS
  Filled 2015-04-07: qty 1

## 2015-04-07 MED ORDER — SODIUM CHLORIDE 0.9 % IV SOLN
1.0000 g | Freq: Once | INTRAVENOUS | Status: AC
  Administered 2015-04-07: 1 g via INTRAVENOUS
  Filled 2015-04-07: qty 1

## 2015-04-07 NOTE — ED Notes (Signed)
Per EMS, pt was picked up from jail, PD reports pt needs to be medically cleared d/t kidney infection.  Pt was discharged from the hospital recently with L upper arm PICC for abx tx.  Pt also reports injecting heroin today.

## 2015-04-07 NOTE — ED Provider Notes (Signed)
CSN: [id number]     Arrival date & time 04/07/15  1111 History   First MD Initiated Contact with Patient 04/07/15 1118     Chief Complaint  Patient presents with  . kidney infection      (Consider location/radiation/quality/duration/timing/severity/associated sxs/prior Treatment) HPI Comments: Patient is a 45 year old female who presents today due to relapsing on heroin and cocaine. Patient states that she is having worsening suprapubic and right flank pain in the last few days. She was recently discharged from the hospital on IV antibiotics for pyelonephritis from a resistant Escherichia coli. She states for the last 5 days she has not had the [facility] since she relapsed.  Pt has complained of one episode of vomiting but is unclear if she has had a fever. She continually falls asleep on exam has to be woken up. She admits to taking heroin today. She is currently in police custody for violating probation  The history is provided by the patient. The history is limited by the condition of the patient.    History reviewed. No pertinent past medical history. History reviewed. No pertinent past surgical history. Family History  Problem Relation Age of Onset  . Arthritis Father   . Throat cancer Other   . Brain cancer Other   . Breast cancer Other   . Hypertension Other    Social History  Substance Use Topics  . Smoking status: Current Every Day Smoker -- 1.00 packs/day    Types: Cigarettes  . Smokeless tobacco: None  . Alcohol Use: Yes     Comment: socially 1 beer a month   OB History    No data available     Review of Systems  All other systems reviewed and are negative.     Allergies  Review of patient's allergies indicates no known allergies.  Home Medications   Prior to Admission medications   Medication Sig Start Date End Date Taking? Authorizing Provider  diclofenac sodium (VOLTAREN) 1 % GEL Apply 4 g topically 4 (four) times daily. 03/26/15   Calvert Cantor, MD   ertapenem 1 g in sodium chloride 0.9 % 50 mL Inject 1 g into the vein daily. 03/26/15   Calvert Cantor, MD  ibuprofen (ADVIL,MOTRIN) 200 MG tablet Take 800 mg by mouth every 4 (four) hours as needed (pain/swelling).     Historical Provider, MD   There were no vitals taken for this visit. Physical Exam  Constitutional: She appears well-developed and well-nourished.  HENT:  Head: Normocephalic and atraumatic.  Mouth/Throat: Oropharynx is clear and moist.  Eyes: Conjunctivae and EOM are normal. Pupils are equal, round, and reactive to light.  Neck: Normal range of motion. Neck supple.  Cardiovascular: Normal rate, regular rhythm and intact distal pulses.   No murmur heard. Pulmonary/Chest: Effort normal and breath sounds normal. No respiratory distress. She has no wheezes. She has no rales.  Abdominal: Soft. She exhibits no distension. There is tenderness in the suprapubic area. There is CVA tenderness. There is no rebound and no guarding.  Mild grimacing with right CVA percussion   Musculoskeletal: Normal range of motion. She exhibits no edema or tenderness.  Neurological:  Appears intoxicated has to be constantly woke up.  Admits to using heroin today and also cocaine  Skin: Skin is warm and dry. No rash noted. No erythema.  Psychiatric:  intoxicated  Nursing note and vitals reviewed.   ED Course  Procedures (including critical care time) Labs Review Labs Reviewed  CBC WITH DIFFERENTIAL/PLATELET - Abnormal; Notable  for the following:    RBC 3.08 (*)    Hemoglobin 8.8 (*)    HCT 28.1 (*)    RDW 18.9 (*)    Platelets 473 (*)    All other components within normal limits  COMPREHENSIVE METABOLIC PANEL - Abnormal; Notable for the following:    Calcium 8.5 (*)    Albumin 3.2 (*)    All other components within normal limits  URINALYSIS, ROUTINE W REFLEX MICROSCOPIC (NOT AT [facility]) - Abnormal; Notable for the following:    APPearance CLOUDY (*)    Hgb urine dipstick TRACE (*)     Ketones, ur 15 (*)    Protein, ur 30 (*)    Leukocytes, UA SMALL (*)    All other components within normal limits  URINE MICROSCOPIC-ADD ON - Abnormal; Notable for the following:    Squamous Epithelial / LPF 0-5 (*)    Bacteria, UA FEW (*)    Casts HYALINE CASTS (*)    All other components within normal limits  URINE RAPID DRUG SCREEN, HOSP PERFORMED - Abnormal; Notable for the following:    Opiates POSITIVE (*)    Cocaine POSITIVE (*)    All other components within normal limits  ETHANOL    Imaging Review No results found. I have personally reviewed and evaluated these images and lab results as part of my medical decision-making.   EKG Interpretation None      MDM   Final diagnoses:  Polysubstance abuse    Patient is a 45 year old female who currently has a PICC line present in her left upper extremity to be getting IV Invanz daily however she relapsed on heroin a proximally 5 days ago and has not had any IV antibiotics since. She denies injecting heroin into her body through the PICC line but has been using peripheral sources. She states her flank and suprapubic pain seems to be worse like when she initially was diagnosed with the infection.  On exam vital signs normal. She does have a history of a loculated pleural effusion which was going to undergo outpatient follow-up. She currently is afebrile. Will do a CBC, CMP and UA to ensure no worsening hypokalemia her renal function. Patient was given a dose of her Invanz  12:40 PM Labs shows improving UA, white blood cells and normal creatinine. Patient given Narcan due to altered mental status. Also UDS and EtOH pending  2:53 PM Pt has been more alert after narcan.  EtOH neg.  patient's labs are improving without signs of worsening. Patient will need antibiotics for the next 4 days due to missing for doses.  Patient was instructed the risk of injecting heroin into her PICC line that it can cause heart conditions, sudden death or  other complications. Patient will be going to jail directly from here so most likely she will still be in general for the next 4 days that she needs antibiotics with lower risk of drug injection.  Gwyneth Sprout, MD 04/07/15 1455

## 2015-04-07 NOTE — Discharge Instructions (Signed)
Needs to continue 1g of Invanz through PICC line daily until 04/11/15 and then PICC line can be discontinued Polysubstance Abuse When people abuse more than one drug or type of drug it is called polysubstance or polydrug abuse. For example, many smokers also drink alcohol. This is one form of polydrug abuse. Polydrug abuse also refers to the use of a drug to counteract an unpleasant effect produced by another drug. It may also be used to help with withdrawal from another drug. People who take stimulants may become agitated. Sometimes this agitation is countered with a tranquilizer. This helps protect against the unpleasant side effects. Polydrug abuse also refers to the use of different drugs at the same time.  Anytime drug use is interfering with normal living activities, it has become abuse. This includes problems with family and friends. Psychological dependence has developed when your mind tells you that the drug is needed. This is usually followed by physical dependence which has developed when continuing increases of drug are required to get the same feeling or "high". This is known as addiction or chemical dependency. A person's risk is much higher if there is a history of chemical dependency in the family. SIGNS OF CHEMICAL DEPENDENCY  You have been told by friends or family that drugs have become a problem.  You fight when using drugs.  You are having blackouts (not remembering what you do while using).  You feel sick from using drugs but continue using.  You lie about use or amounts of drugs (chemicals) used.  You need chemicals to get you going.  You are suffering in work performance or in school because of drug use.  You get sick from use of drugs but continue to use anyway.  You need drugs to relate to people or feel comfortable in social situations.  You use drugs to forget problems. "Yes" answered to any of the above signs of chemical dependency indicates there are problems.  The longer the use of drugs continues, the greater the problems will become. If there is a family history of drug or alcohol use, it is best not to experiment with these drugs. Continual use leads to tolerance. After tolerance develops more of the drug is needed to get the same feeling. This is followed by addiction. With addiction, drugs become the most important part of life. It becomes more important to take drugs than participate in the other usual activities of life. This includes relating to friends and family. Addiction is followed by dependency. Dependency is a condition where drugs are now needed not just to get high, but to feel normal. Addiction cannot be cured but it can be stopped. This often requires outside help and the care of professionals. Treatment centers are listed in the yellow pages under: Cocaine, Narcotics, and Alcoholics Anonymous. Most hospitals and clinics can refer you to a specialized care center. Talk to your caregiver if you need help.   This information is not intended to replace advice given to you by your health care provider. Make sure you discuss any questions you have with your health care provider.   Document Released: 10/04/2004 Document Revised: 05/07/2011 Document Reviewed: 02/17/2014 Elsevier Interactive Patient Education [employer]

## 2015-04-07 NOTE — ED Notes (Signed)
Bed: WA20 Expected date:  Expected time:  Means of arrival:  Comments: 

## 2015-04-25 ENCOUNTER — Ambulatory Visit: Payer: Self-pay | Admitting: Family Medicine

## 2015-05-16 ENCOUNTER — Encounter: Payer: Self-pay | Admitting: Family Medicine

## 2015-05-16 ENCOUNTER — Ambulatory Visit (INDEPENDENT_AMBULATORY_CARE_PROVIDER_SITE_OTHER): Payer: Self-pay | Admitting: Family Medicine

## 2015-05-16 VITALS — BP 101/83 | HR 93 | Temp 97.6°F | Ht 62.0 in | Wt 135.0 lb

## 2015-05-16 DIAGNOSIS — Z7689 Persons encountering health services in other specified circumstances: Secondary | ICD-10-CM

## 2015-05-16 DIAGNOSIS — Z7189 Other specified counseling: Secondary | ICD-10-CM

## 2015-05-16 DIAGNOSIS — J90 Pleural effusion, not elsewhere classified: Secondary | ICD-10-CM

## 2015-05-16 DIAGNOSIS — Z87440 Personal history of urinary (tract) infections: Secondary | ICD-10-CM

## 2015-05-16 LAB — CBC WITH DIFFERENTIAL/PLATELET
BASOS ABS: 0.1 10*3/uL (ref 0.0–0.1)
Basophils Relative: 1 % (ref 0–1)
EOS PCT: 1 % (ref 0–5)
Eosinophils Absolute: 0.1 10*3/uL (ref 0.0–0.7)
HEMATOCRIT: 38.7 % (ref 36.0–46.0)
HEMOGLOBIN: 12.4 g/dL (ref 12.0–15.0)
LYMPHS PCT: 25 % (ref 12–46)
Lymphs Abs: 2 10*3/uL (ref 0.7–4.0)
MCH: 29 pg (ref 26.0–34.0)
MCHC: 32 g/dL (ref 30.0–36.0)
MCV: 90.6 fL (ref 78.0–100.0)
MPV: 10.1 fL (ref 8.6–12.4)
Monocytes Absolute: 0.6 10*3/uL (ref 0.1–1.0)
Monocytes Relative: 8 % (ref 3–12)
NEUTROS ABS: 5.2 10*3/uL (ref 1.7–7.7)
Neutrophils Relative %: 65 % (ref 43–77)
Platelets: 404 10*3/uL — ABNORMAL HIGH (ref 150–400)
RBC: 4.27 MIL/uL (ref 3.87–5.11)
RDW: 16.8 % — AB (ref 11.5–15.5)
WBC: 8 10*3/uL (ref 4.0–10.5)

## 2015-05-16 LAB — POCT URINALYSIS DIP (DEVICE)
BILIRUBIN URINE: NEGATIVE
Glucose, UA: NEGATIVE mg/dL
KETONES UR: NEGATIVE mg/dL
Nitrite: NEGATIVE
Protein, ur: NEGATIVE mg/dL
Specific Gravity, Urine: >=1.03 (ref 1.005–1.030)
Urobilinogen, UA: 0.2 mg/dL (ref 0.0–1.0)
pH: 5.5 (ref 5.0–8.0)

## 2015-05-16 NOTE — Patient Instructions (Signed)
We will notify you if further intervention is needed.

## 2015-05-16 NOTE — Progress Notes (Signed)
Patient ID: Chelsea Blake, female   DOB: 02-02-1971, 45 y.o.   MRN: [id number]   Chelsea Blake, is a 45 y.o. female  [id number]  [id number]  DOB - 02-02-1971  CC:  Chief Complaint  Patient presents with  . new patient/establish care    upper back pain on right side, history of recent hospitalization for UTI        HPI: Chelsea Blake is a 45 y.o. female here to establish care. She presents in the custody of a [employer] [facility] She was in hospital in January for 8 days and was told to follow-up here to establish care. In January she had pyelonephritis and was treated with antibiotics. She reports not completing her course of antibiotics because she was taken to jail. She would like a follow-up to ensure the infection is cleared. She does not report any suggestive symptoms. She was also found to have a loculated pleural effusion. She is currently on no chronic medications.She has a history of polysubstance abuse, but is currently clean.Her only significant complaint today is of upper right back pain. She does report smoking cigarettes. Health Maintenance:  She is in need of a PAP and mammogram. She also needs a Tdap but declines today. She has been screened for HIV.   No Known Allergies History reviewed. No pertinent past medical history. Current Outpatient Prescriptions on File Prior to Visit  Medication Sig Dispense Refill  . diclofenac sodium (VOLTAREN) 1 % GEL Apply 4 g topically 4 (four) times daily. (Patient not taking: Reported on 05/16/2015) 100 g 0  . ertapenem 1 g in sodium chloride 0.9 % 50 mL Inject 1 g into the vein daily. (Patient not taking: Reported on 05/16/2015) 10 Dose 0  . ibuprofen (ADVIL,MOTRIN) 200 MG tablet Take 800 mg by mouth every 4 (four) hours as needed (pain/swelling). Reported on 05/16/2015     No current facility-administered medications on file prior to visit.   Family History  Problem Relation Age of Onset  .  Arthritis Father   . Throat cancer Other   . Brain cancer Other   . Breast cancer Other   . Hypertension Other    Social History   Social History  . Marital Status: Divorced    Spouse Name: N/A  . Number of Children: N/A  . Years of Education: N/A   Occupational History  . Not on file.   Social History Main Topics  . Smoking status: Former Smoker -- 1.00 packs/day    Types: Cigarettes  . Smokeless tobacco: Not on file  . Alcohol Use: No     Comment: socially 1 beer a month  . Drug Use: No     Comment: crack, heroin  . Sexual Activity: Not Currently   Other Topics Concern  . Not on file   Social History Narrative    Review of Systems: Constitutional: Negative for fever, chills, appetite change, weight loss,  Fatigue. Skin: Negative for rashes or lesions of concern. HENT: Negative for ear pain, ear discharge.nose bleeds Eyes: Negative for pain, discharge, redness, itching. Positive for occassional blurry vision when she attempts to read Neck: Negative for pain, stiffness Respiratory: Negative for cough, shortness of breath,   Cardiovascular: Negative for chest pain, palpitations and leg swelling. Gastrointestinal: Negative for abdominal pain, nausea, vomiting, diarrhea, constipations Genitourinary: Negative for dysuria, urgency, hematuria. Reports some urinary frequency Musculoskeletal: Positive fo right upper back pain. Negative for otherjoint pain, joint  swelling, and gait problem.Negative for weakness.  Neurological: Negative for tremors, seizures, syncope,   light-headedness, numbness and headaches. Positive for occassional  Dizziness with positional change. Hematological: Negative for easy bruising or bleeding Psychiatric/Behavioral: Positive for depression, anxiety.  Objective:   Filed Vitals:   05/16/15 1116  BP: 101/83  Pulse: 93  Temp: 97.6 F (36.4 C)    Physical Exam: Constitutional: Patient appears well-developed and well-nourished. No  distress. HENT: Normocephalic, atraumatic, External right and left ear normal. Oropharynx is clear and moist.  Eyes: Conjunctivae and EOM are normal. PERRLA, no scleral icterus. Neck: Normal ROM. Neck supple. No lymphadenopathy, No thyromegaly. CVS: RRR, S1/S2 +, no murmurs, no gallops, no rubs Pulmonary: Effort and breath sounds normal, no stridor, rhonchi, wheezes, rales.  Abdominal: Soft. Normoactive BS,, no distension, tenderness, rebound or guarding.  Musculoskeletal: Normal range of motion. No edema and no tenderness.  Neuro: Alert.Normal muscle tone coordination. Non-focal Skin: Skin is warm and dry. No rash noted. Not diaphoretic. No erythema. No pallor. Psychiatric: Normal mood and affect. Behavior, judgment, thought content normal.  Lab Results  Component Value Date   WBC 8.7 04/07/2015   HGB 8.8* 04/07/2015   HCT 28.1* 04/07/2015   MCV 91.2 04/07/2015   PLT 473* 04/07/2015   Lab Results  Component Value Date   CREATININE 0.59 04/07/2015   BUN 15 04/07/2015   NA 136 04/07/2015   K 3.7 04/07/2015   CL 102 04/07/2015   CO2 26 04/07/2015    No results found for: HGBA1C Lipid Panel  No results found for: CHOL, TRIG, HDL, CHOLHDL, VLDL, LDLCALC     Assessment and plan:   1. Pleural effusion  - DG Chest 2 View; Future - COMPLETE METABOLIC PANEL WITH GFR - CBC with Differential - Lipid panel  2. History of kidney infection  -  - Urine culture - CBC with Differential - POCT urinalysis dip (device) - CULTURE, URINE COMPREHENSIVE  3. Encounter to establish care -Have reviewed information presented by the patient and pertinent information from her chart.   Return if symptoms worsen or fail to improve.  The patient was given clear instructions to go to ER or return to medical center if symptoms don't improve, worsen or new problems develop. The patient verbalized understanding.    Henrietta Hoover FNP  05/16/2015, 1:16 PM

## 2015-05-17 LAB — COMPLETE METABOLIC PANEL WITH GFR
ALT: 39 U/L — ABNORMAL HIGH (ref 6–29)
AST: 31 U/L — ABNORMAL HIGH (ref 10–30)
Albumin: 4.2 g/dL (ref 3.6–5.1)
Alkaline Phosphatase: 72 U/L (ref 33–115)
BUN: 15 mg/dL (ref 7–25)
CALCIUM: 9.6 mg/dL (ref 8.6–10.2)
CHLORIDE: 103 mmol/L (ref 98–110)
CO2: 26 mmol/L (ref 20–31)
Creat: 0.57 mg/dL (ref 0.50–1.10)
GFR, Est Non African American: >89 mL/min (ref >=60)
Glucose, Bld: 95 mg/dL (ref 65–99)
POTASSIUM: 4.4 mmol/L (ref 3.5–5.3)
Sodium: 137 mmol/L (ref 135–146)
Total Bilirubin: 0.3 mg/dL (ref 0.2–1.2)
Total Protein: 7.7 g/dL (ref 6.1–8.1)

## 2015-05-17 LAB — LIPID PANEL
Cholesterol: 195 mg/dL (ref 125–200)
HDL: 71 mg/dL (ref >=46)
LDL CALC: 91 mg/dL (ref <130)
TRIGLYCERIDES: 164 mg/dL — AB (ref <150)
Total CHOL/HDL Ratio: 2.7 Ratio (ref <=5.0)
VLDL: 33 mg/dL — ABNORMAL HIGH (ref <30)

## 2015-05-20 LAB — CULTURE, URINE COMPREHENSIVE: Colony Count: 25000

## 2016-12-03 IMAGING — DX DG CHEST 2V
2 series · 2 of 2 positions shown · non-contrast
Comparison: 06/26/2012

CLINICAL DATA: Fever to 102 degrees, cough for 3 months, LEFT side
chest pain, RIGHT-sided chest tightness smoker, rib pain

EXAM:
CHEST  2 VIEW

[chest pa]
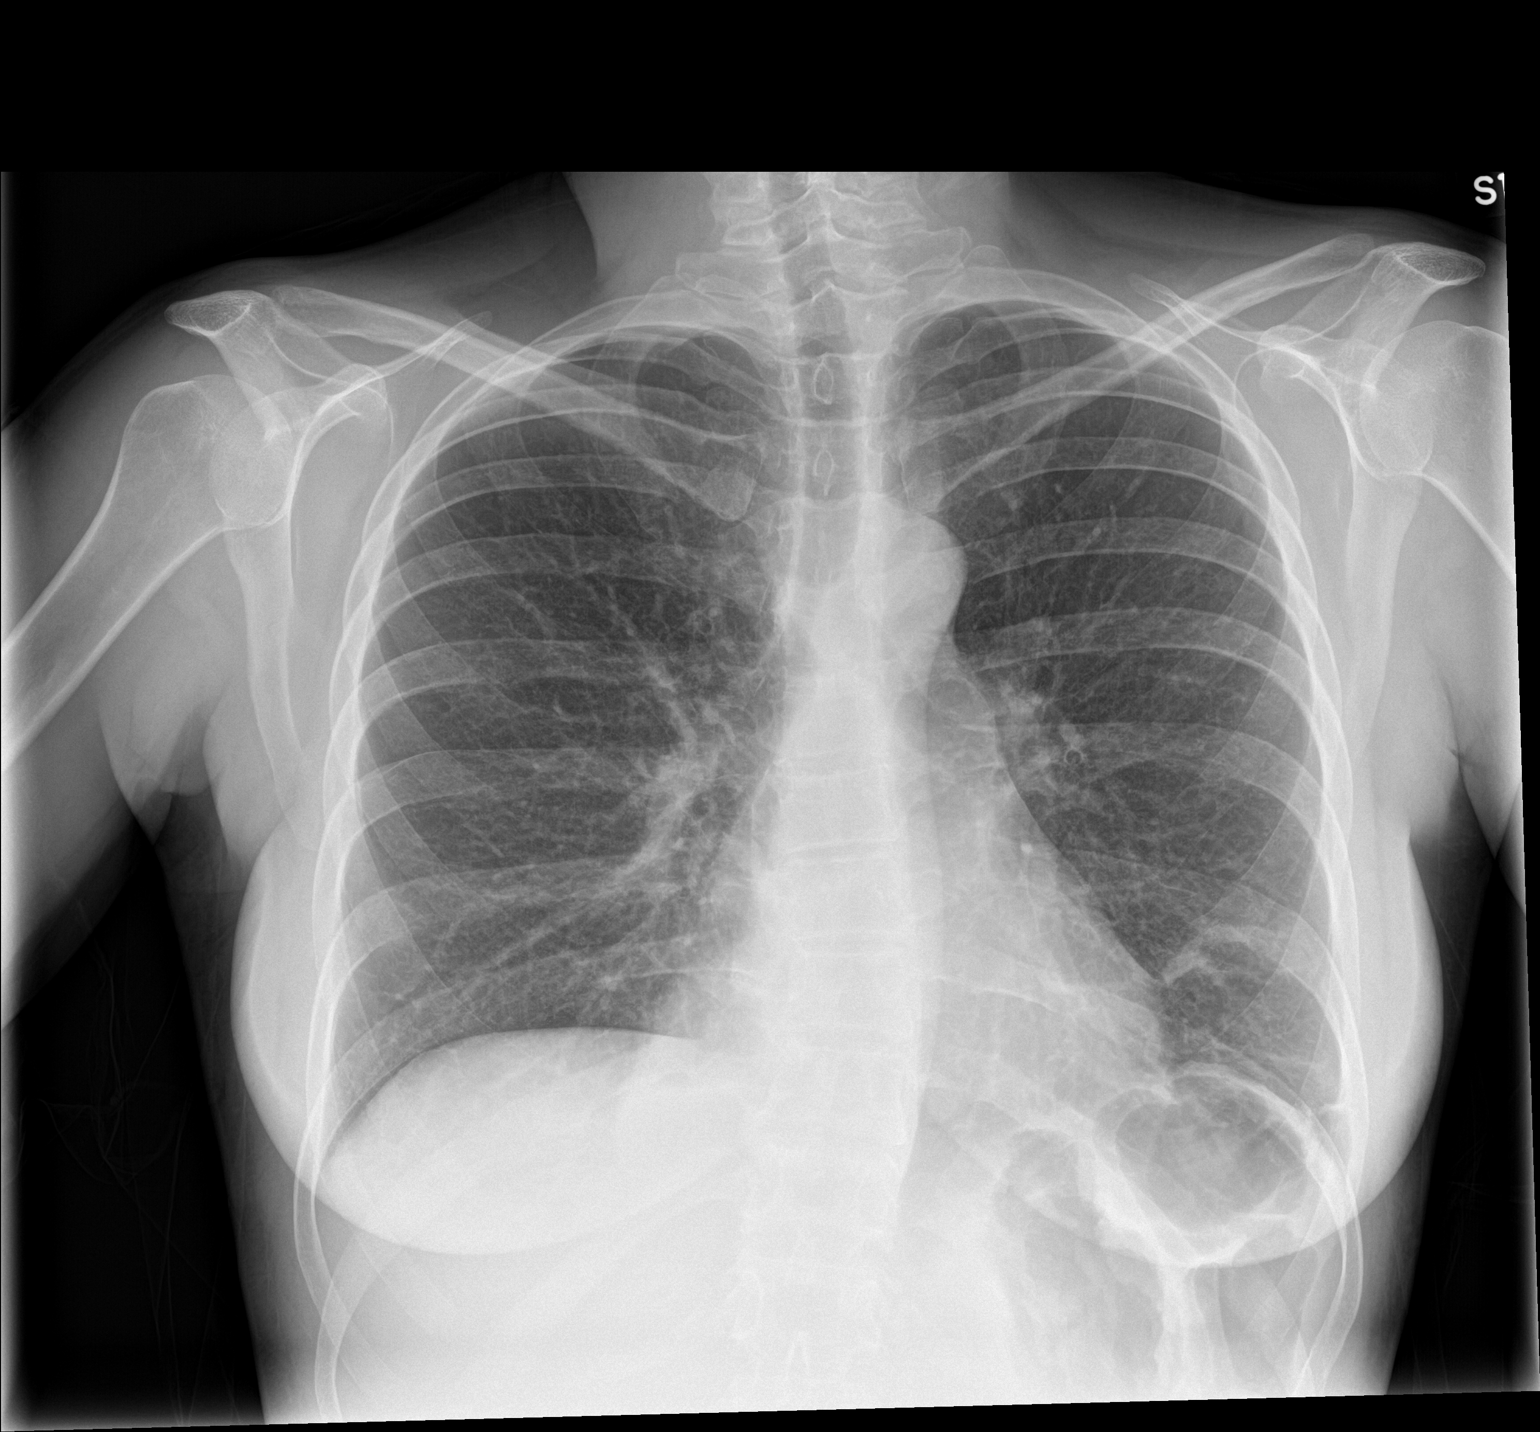

[chest lat]
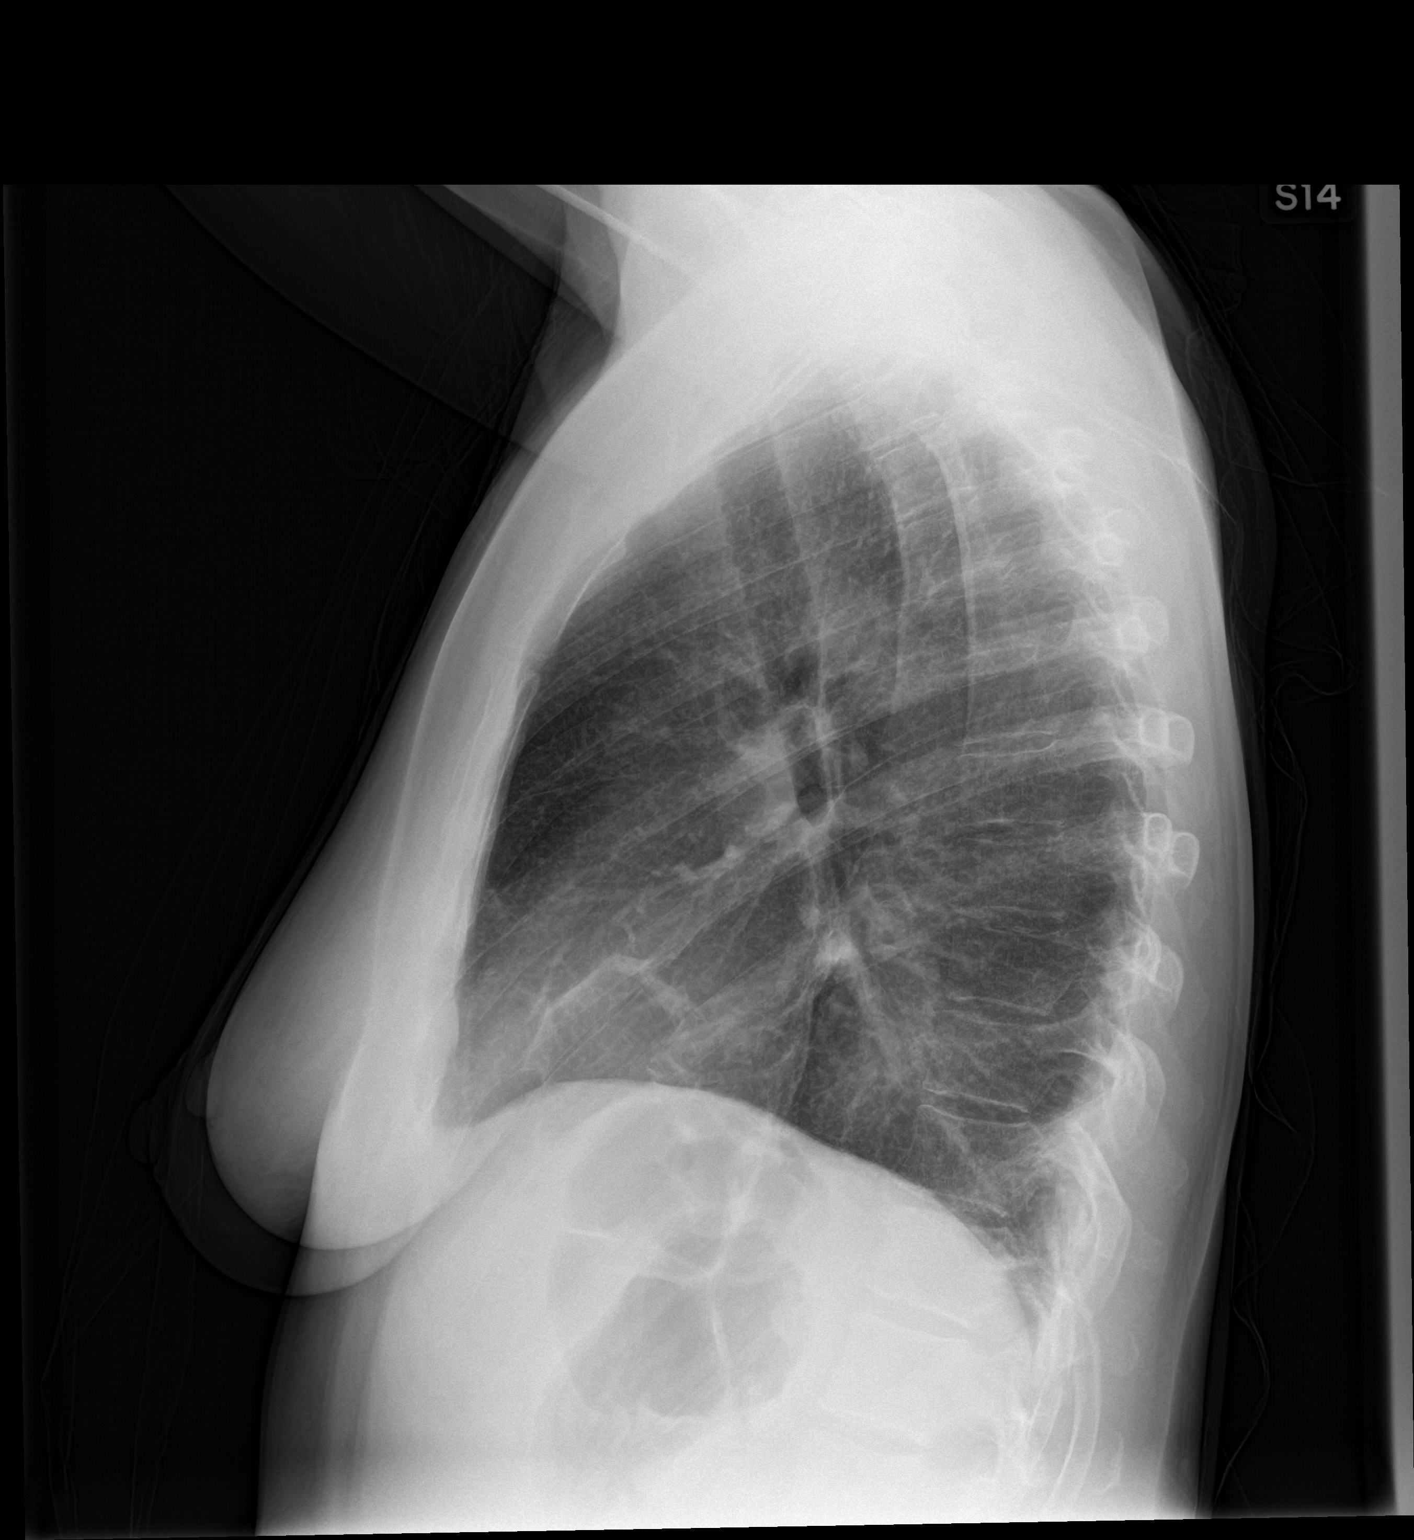

[2 of 2 positions shown; findings below may reference images not displayed]

FINDINGS: Normal heart size, mediastinal contours, and pulmonary vascularity.

Moderate bronchitic changes new since previous exam.

Subsegmental atelectasis at base of lingula, new.

No definite pulmonary infiltrate, pleural effusion or pneumothorax.

Bones unremarkable.
IMPRESSION: Moderate bronchitic changes with subsegmental atelectasis at lingula
new since prior exam.

## 2016-12-03 IMAGING — CT CT RENAL STONE PROTOCOL
2 of 4 series · 15 of 46 positions shown, 17 images · non-contrast
Comparison: None.

CLINICAL DATA: Abdominal pain and back pain for 3 months getting
worse, right flank pain

EXAM:
CT ABDOMEN AND PELVIS WITHOUT CONTRAST
TECHNIQUE: Multidetector CT imaging of the abdomen and pelvis was performed
following the standard protocol without IV contrast.

[Series 2: renal stone 5mm · axial · 0.73mm/px · z∈[+632,+1052]mm · 12 of 96 slices shown, 14 images]
[im 8/96  soft-tissue]
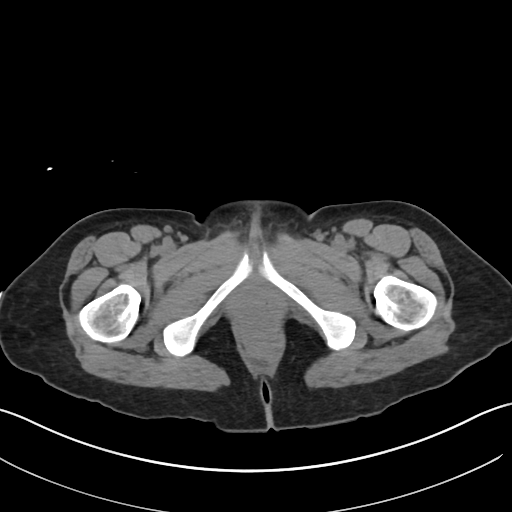
[im 8/96  bone]
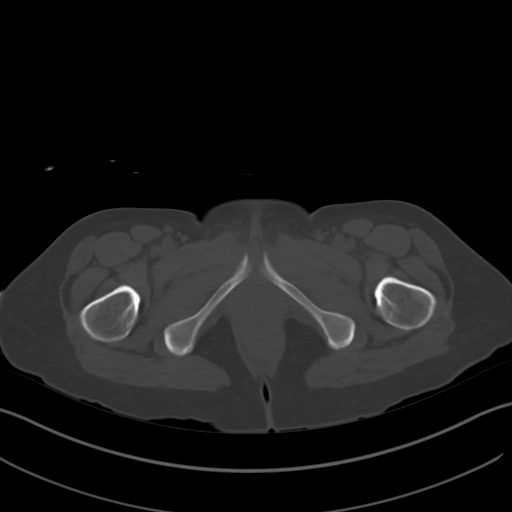
[im 16/96  soft-tissue]
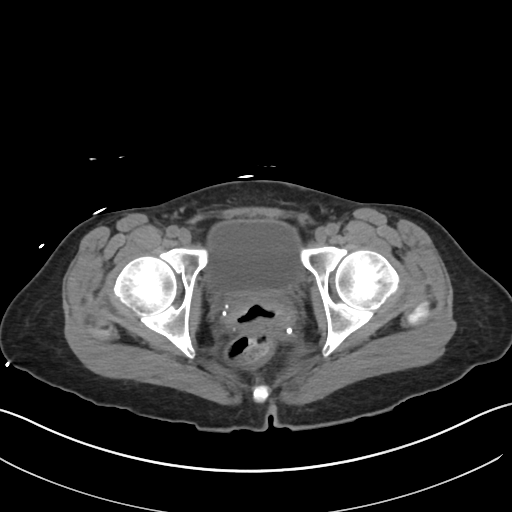
[im 23/96  soft-tissue]
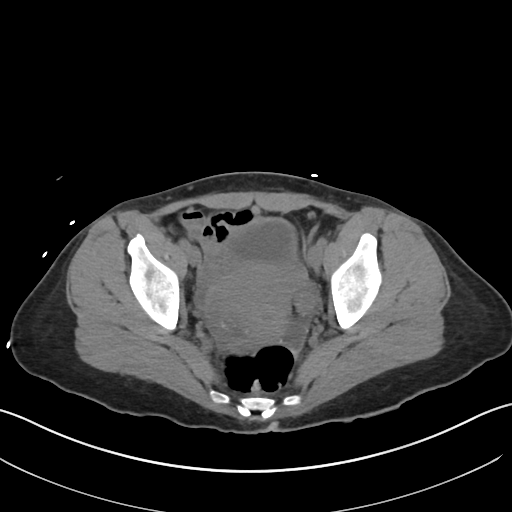
[im 31/96  soft-tissue]
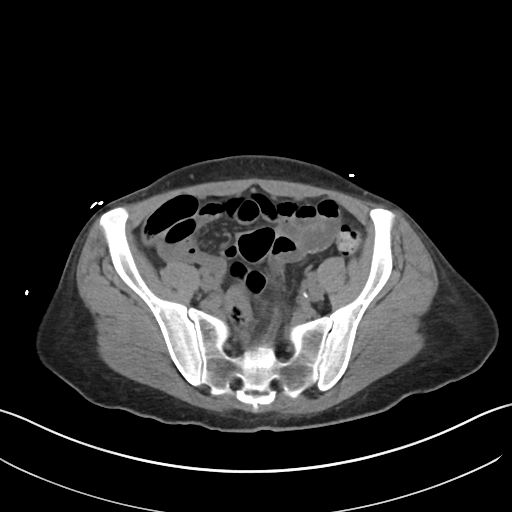
[im 39/96  soft-tissue]
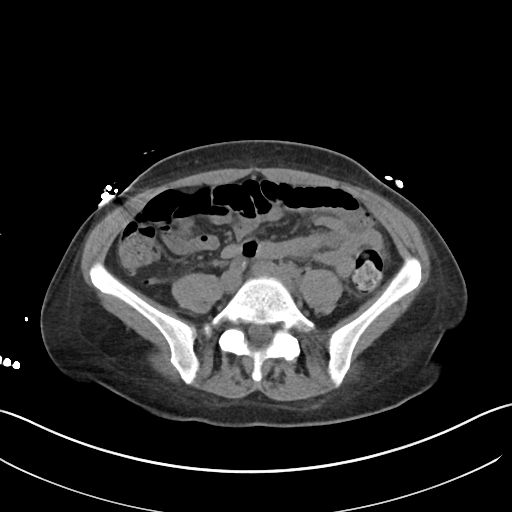
[im 46/96  soft-tissue]
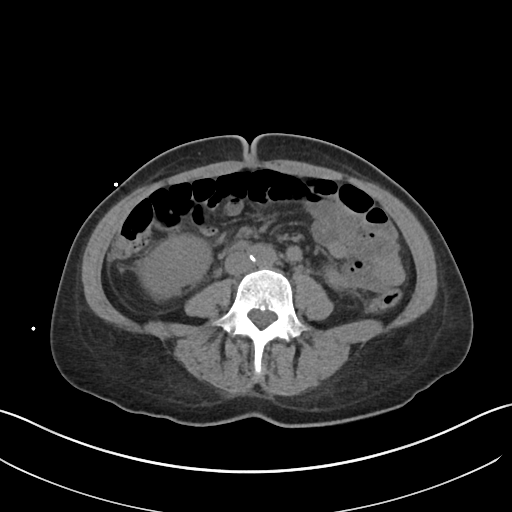
[im 54/96  soft-tissue]
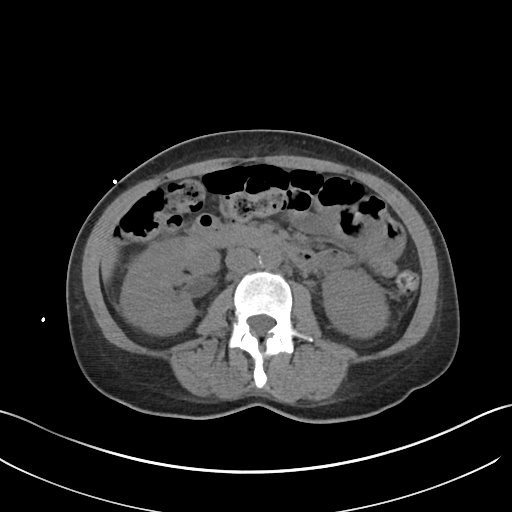
[im 61/96  soft-tissue]
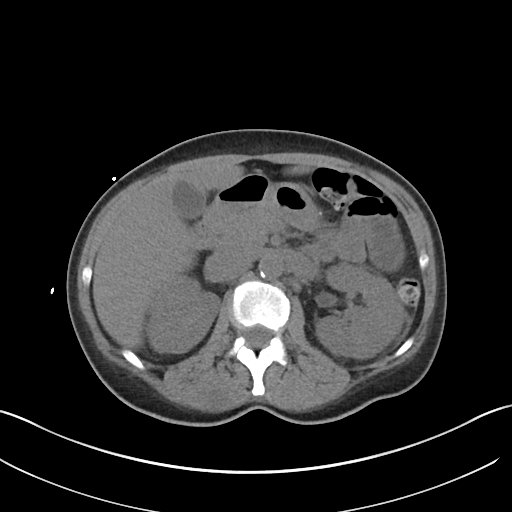
[im 69/96  soft-tissue]
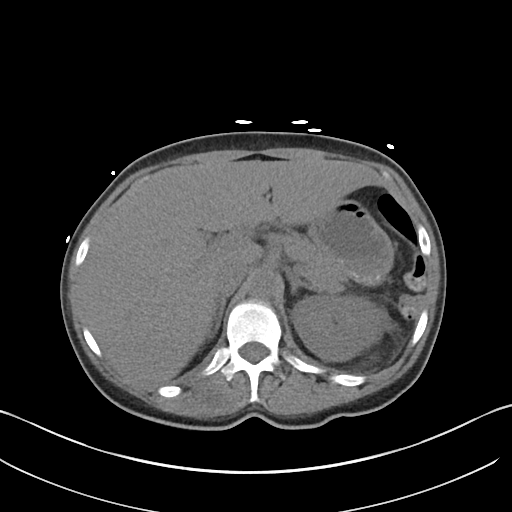
[im 69/96  bone]
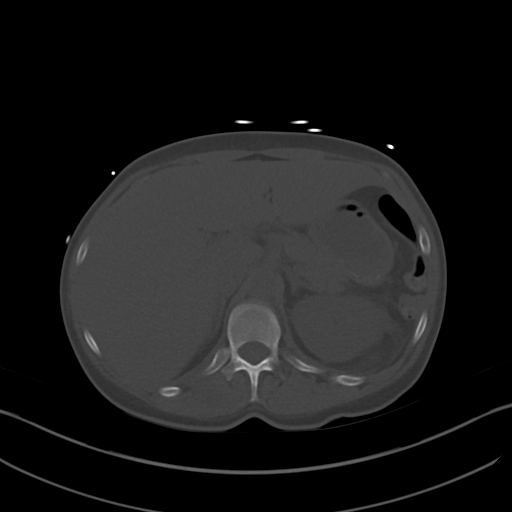
[im 77/96  soft-tissue]
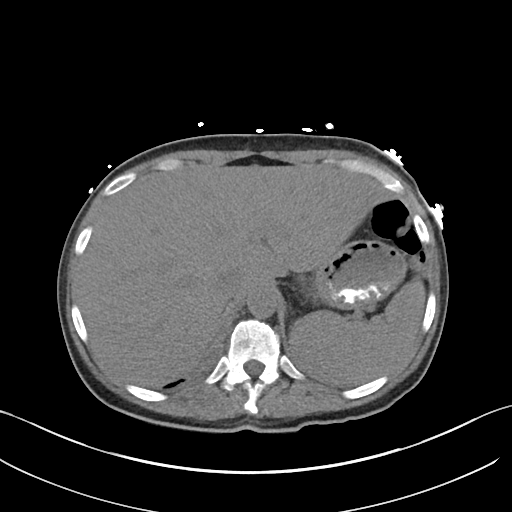
[im 84/96  soft-tissue]
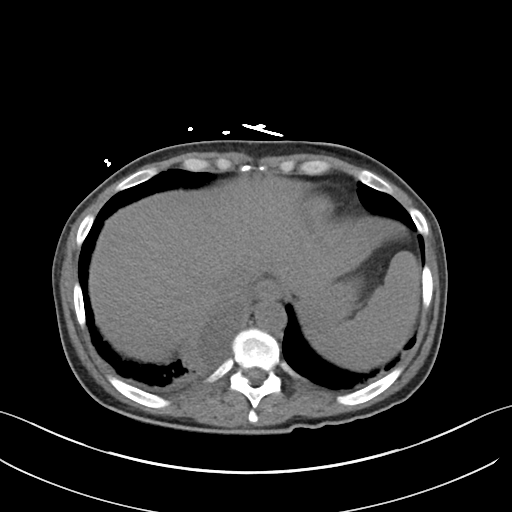
[im 92/96  soft-tissue]
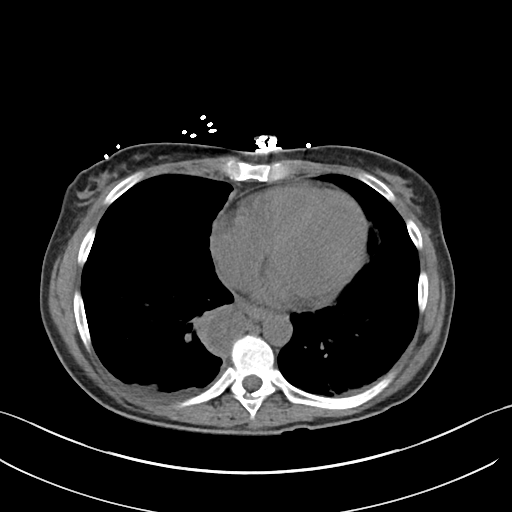

[Series 4: renal stone 3.0 cor · coronal · 0.60mm/px · 3 of 69 slices shown]
[im 23/69  soft-tissue]
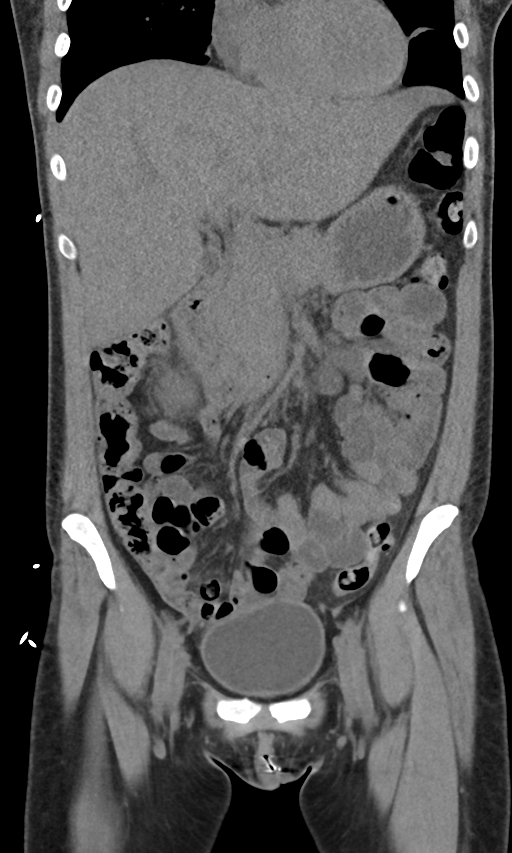
[im 31/69  soft-tissue]
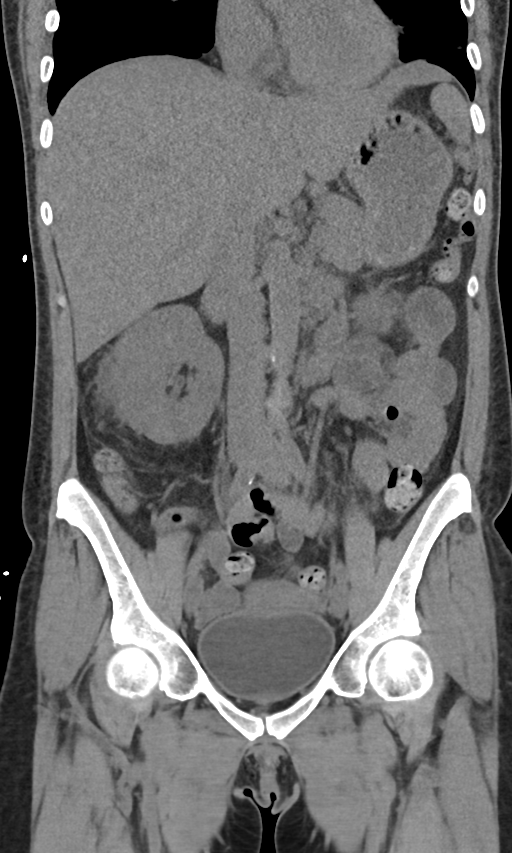
[im 38/69  soft-tissue]
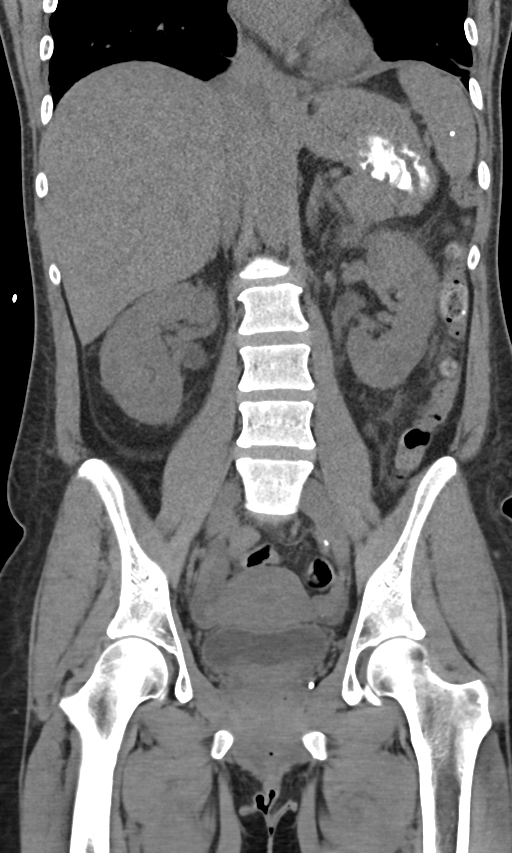

[15 of 46 positions shown; findings below may reference images not displayed]

FINDINGS: Lower chest: Tiny pleural effusions. Partially visualized thick
walled pleural-based mass with low-attenuation center extending from
the sub carinal region to the extreme inferior right lung base.

Hepatobiliary: Negative

Pancreas: Negative

Spleen: Negative

Adrenals/Urinary Tract: Adrenal glands are normal. There is moderate
bilateral perinephric inflammation. No renal or ureteral stones. No
hydronephrosis. Bladder appears normal.

Stomach/Bowel: Tiny volume of oral contrast seen in the stomach.
Nonobstructive gas pattern. Stomach small bowel and appendix are
normal. Large bowel is normal.

Vascular/Lymphatic: Mild aortoiliac calcification. 12 mm loop
periaortic lymph node at the level of the left renal hilum.

Reproductive: Negative

Other: Suspect small volume free fluid in the pelvis although
difficult to distinguish from adjacent non-opacified loops of bowel.

Musculoskeletal: No acute findings
IMPRESSION: 1. Bilateral perinephric inflammation without hydronephrosis.
Bilateral pyelonephritis is 1 consideration. Systemic active renal
disease is another consideration.
2. Pleural-based thick walled mass medial right lung base only
partially visualized. This requires further evaluation. Recommend
contrast-enhanced CT chest abdomen and pelvis if the patient is a
candidate.

## 2019-10-15 ENCOUNTER — Ambulatory Visit (HOSPITAL_COMMUNITY): Payer: Self-pay | Admitting: Licensed Clinical Social Worker

## 2019-11-04 ENCOUNTER — Ambulatory Visit (HOSPITAL_COMMUNITY): Payer: Self-pay | Admitting: Psychiatry

## 2019-12-31 ENCOUNTER — Ambulatory Visit (HOSPITAL_COMMUNITY): Payer: No Payment, Other | Admitting: Psychiatry

## 2020-02-08 ENCOUNTER — Ambulatory Visit (HOSPITAL_COMMUNITY): Payer: Self-pay | Admitting: Psychiatry

## 2020-02-10 ENCOUNTER — Other Ambulatory Visit: Payer: Self-pay

## 2020-02-10 ENCOUNTER — Telehealth (HOSPITAL_COMMUNITY): Payer: Self-pay | Admitting: Family Medicine

## 2020-02-10 ENCOUNTER — Other Ambulatory Visit (HOSPITAL_COMMUNITY): Payer: Self-pay | Admitting: Psychiatry

## 2020-02-10 ENCOUNTER — Ambulatory Visit (INDEPENDENT_AMBULATORY_CARE_PROVIDER_SITE_OTHER): Payer: No Payment, Other | Admitting: Psychiatry

## 2020-02-10 ENCOUNTER — Encounter (HOSPITAL_COMMUNITY): Payer: Self-pay | Admitting: Psychiatry

## 2020-02-10 VITALS — BP 124/85 | HR 87 | Ht 62.0 in | Wt 138.0 lb

## 2020-02-10 DIAGNOSIS — F250 Schizoaffective disorder, bipolar type: Secondary | ICD-10-CM | POA: Insufficient documentation

## 2020-02-10 DIAGNOSIS — F4310 Post-traumatic stress disorder, unspecified: Secondary | ICD-10-CM | POA: Insufficient documentation

## 2020-02-10 DIAGNOSIS — F411 Generalized anxiety disorder: Secondary | ICD-10-CM | POA: Diagnosis not present

## 2020-02-10 MED ORDER — BUSPIRONE HCL 10 MG PO TABS: 10.0000 mg | ORAL_TABLET | Freq: Three times a day (TID) | ORAL | 2 refills | Status: DC

## 2020-02-10 MED ORDER — QUETIAPINE FUMARATE 25 MG PO TABS: 25.0000 mg | ORAL_TABLET | Freq: Two times a day (BID) | ORAL | 2 refills | Status: DC

## 2020-02-10 MED ORDER — QUETIAPINE FUMARATE 100 MG PO TABS: 150.0000 mg | ORAL_TABLET | Freq: Every day | ORAL | 2 refills | Status: DC

## 2020-02-10 MED FILL — busPIRone HCL 10 MG TABS: 10 | 30 days supply | Qty: 90 | Fill #0

## 2020-02-10 MED FILL — QUETIAPINE FUMARATE 100 MG: 100 | 30 days supply | Qty: 45 | Fill #0

## 2020-02-10 MED FILL — QUETIAPINE FUMARATE 25 MG T: 25 | 30 days supply | Qty: 60 | Fill #0
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
## 2020-02-12 ENCOUNTER — Ambulatory Visit: Payer: Self-pay | Admitting: Family

## 2020-02-15 ENCOUNTER — Encounter: Payer: Self-pay | Admitting: Family

## 2020-02-15 NOTE — Progress Notes (Signed)
Patient did not show for appointment.   

## 2020-03-03 ENCOUNTER — Ambulatory Visit (HOSPITAL_COMMUNITY): Payer: No Payment, Other | Admitting: Clinical

## 2020-03-04 ENCOUNTER — Encounter: Payer: Self-pay | Admitting: Family

## 2020-03-04 ENCOUNTER — Telehealth (INDEPENDENT_AMBULATORY_CARE_PROVIDER_SITE_OTHER): Payer: Self-pay | Admitting: Family

## 2020-03-04 DIAGNOSIS — Z7689 Persons encountering health services in other specified circumstances: Secondary | ICD-10-CM

## 2020-03-04 NOTE — Progress Notes (Unsigned)
Wants physical/pap

## 2020-03-04 NOTE — Progress Notes (Signed)
Virtual Visit via Telephone Note  I connected with Chelsea Blake, on 03/04/2020 at 9:35 AM by telephone due to the COVID-19 pandemic and verified that I am speaking with the correct person using two identifiers.  Due to current restrictions/limitations of in-office visits due to the COVID-19 pandemic, this scheduled clinical appointment was converted to a telehealth visit.   Consent: I discussed the limitations, risks, security and privacy concerns of performing an evaluation and management service by telephone and the availability of in person appointments. I also discussed with the patient that there may be a patient responsible charge related to this service. The patient expressed understanding and agreed to proceed.   Location of Patient: Home  Location of Provider: [facility]   Persons participating in Telemedicine visit: Jenae Miyoko Hashimi Ricky Stabs, NP Eloise Levels, RN  History of Present Illness: Chelsea Blake is to establish care. Patient has a PMH significant for loculated pleural effusion, pyelonephritis, sepsis, hypokalemia, schizoaffective disorder bipolar type, post-traumatic stress disorder, generalized anxiety disorder.   Current issues and/or concerns: - None  ROS per HPI   Health Maintenance:  Health Maintenance Due  Topic Date Due   COVID-19 Vaccine (1) Never done   TETANUS/TDAP  Never done   PAP SMEAR-Modifier  Never done   COLONOSCOPY (Pts 45-34yrs Insurance coverage will need to be confirmed)  Never done   INFLUENZA VACCINE  Never done    No past medical history on file. Allergies  Allergen Reactions   Codeine Itching    Current Outpatient Medications on File Prior to Visit  Medication Sig Dispense Refill   busPIRone (BUSPAR) 10 MG tablet Take 1 tablet (10 mg total) by mouth 3 (three) times daily. 90 tablet 2   QUEtiapine (SEROQUEL) 100 MG tablet Take 1.5 tablets (150 mg  total) by mouth at bedtime. 45 tablet 2   QUEtiapine (SEROQUEL) 25 MG tablet Take 1 tablet (25 mg total) by mouth 2 (two) times daily. 30 tablet 2   No current facility-administered medications on file prior to visit.    Observations/Objective: Alert and oriented x 3. Not in acute distress. Physical examination not completed as this is a telemedicine visit.  Assessment and Plan: 1. Encounter to establish care: - Patient presents today to establish care.  - Return in 4 to 6 weeks or sooner if needed for annual physical examination, labs, and health maintenance. Arrive fasting meaning having had no food and/or nothing to drink for at least 8 hours prior to appointment.  Follow Up Instructions: Follow-up in 4 to 6 weeks or sooner if needed for annual physical examination.   Patient was given clear instructions to go to Emergency Department or return to medical center if symptoms don't improve, worsen, or new problems develop.The patient verbalized understanding.  I discussed the assessment and treatment plan with the patient. The patient was provided an opportunity to ask questions and all were answered. The patient agreed with the plan and demonstrated an understanding of the instructions.   The patient was advised to call back or seek an in-person evaluation if the symptoms worsen or if the condition fails to improve as anticipated.   I provided 5 minutes total of non-face-to-face time during this encounter including median intraservice time, reviewing previous notes, labs, imaging, medications, management and patient verbalized understanding.    Rema Fendt, NP  [facility] [place], [place] [phone] 03/05/2020, 8:40 PM

## 2020-03-22 ENCOUNTER — Telehealth: Payer: Self-pay | Admitting: Family

## 2020-03-22 ENCOUNTER — Telehealth: Payer: Self-pay | Admitting: Family Medicine

## 2020-03-22 MED FILL — QUETIAPINE FUMARATE 100 MG: 100 | 30 days supply | Qty: 45 | Fill #1

## 2020-03-22 MED FILL — busPIRone HCL 10 MG TABS: 10 | 30 days supply | Qty: 90 | Fill #1

## 2020-03-22 MED FILL — QUETIAPINE FUMARATE 25 MG T: 25 | 15 days supply | Qty: 30 | Fill #1

## 2020-03-22 NOTE — Telephone Encounter (Signed)
Patient called in upset requesting a call back from [facility] regarding her [place] application. Please follow up.

## 2020-03-22 NOTE — Telephone Encounter (Signed)
I return Pt call and schedule an appt with financial on 03/30/20

## 2020-03-23 ENCOUNTER — Encounter: Payer: Self-pay | Admitting: Family

## 2020-03-30 ENCOUNTER — Ambulatory Visit: Payer: Self-pay

## 2020-04-20 ENCOUNTER — Telehealth: Payer: Self-pay | Admitting: Family
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
## 2020-04-27 ENCOUNTER — Other Ambulatory Visit (HOSPITAL_COMMUNITY): Payer: Self-pay | Admitting: Psychiatry

## 2020-04-27 ENCOUNTER — Telehealth: Payer: Self-pay | Admitting: Family

## 2020-04-27 DIAGNOSIS — F250 Schizoaffective disorder, bipolar type: Secondary | ICD-10-CM

## 2020-04-27 MED FILL — QUETIAPINE FUMARATE 25 MG T: 25 | 15 days supply | Qty: 30 | Fill #0

## 2020-04-27 MED FILL — busPIRone HCL 10 MG TABS: 10 | 30 days supply | Qty: 90 | Fill #2
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
## 2020-04-28 NOTE — Telephone Encounter (Signed)
Call placed to patient. Mailbox was full; therefore, LCSW was unable to leave a return message.

## 2020-05-02 ENCOUNTER — Institutional Professional Consult (permissible substitution): Payer: Self-pay | Admitting: Licensed Clinical Social Worker

## 2020-05-09 ENCOUNTER — Institutional Professional Consult (permissible substitution): Payer: Self-pay | Admitting: Licensed Clinical Social Worker

## 2020-05-10 ENCOUNTER — Telehealth (HOSPITAL_COMMUNITY): Payer: No Payment, Other | Admitting: Psychiatry

## 2020-05-10 ENCOUNTER — Other Ambulatory Visit: Payer: Self-pay

## 2020-05-11 ENCOUNTER — Telehealth (INDEPENDENT_AMBULATORY_CARE_PROVIDER_SITE_OTHER): Payer: No Payment, Other | Admitting: Psychiatry

## 2020-05-11 ENCOUNTER — Other Ambulatory Visit: Payer: Self-pay

## 2020-05-11 ENCOUNTER — Encounter (HOSPITAL_COMMUNITY): Payer: Self-pay | Admitting: Psychiatry

## 2020-05-11 ENCOUNTER — Other Ambulatory Visit (HOSPITAL_COMMUNITY): Payer: Self-pay | Admitting: Psychiatry

## 2020-05-11 DIAGNOSIS — F411 Generalized anxiety disorder: Secondary | ICD-10-CM | POA: Diagnosis not present

## 2020-05-11 DIAGNOSIS — F250 Schizoaffective disorder, bipolar type: Secondary | ICD-10-CM | POA: Diagnosis not present

## 2020-05-11 MED ORDER — QUETIAPINE FUMARATE 100 MG PO TABS: 150.0000 mg | ORAL_TABLET | Freq: Every day | ORAL | 2 refills | Status: DC

## 2020-05-11 MED ORDER — TRAZODONE HCL 50 MG PO TABS: 50.0000 mg | ORAL_TABLET | Freq: Every evening | ORAL | 2 refills | Status: DC | PRN

## 2020-05-11 MED ORDER — HYDROXYZINE HCL 10 MG PO TABS: 10.0000 mg | ORAL_TABLET | Freq: Three times a day (TID) | ORAL | 2 refills | Status: DC | PRN

## 2020-05-11 MED ORDER — BUSPIRONE HCL 15 MG PO TABS: 15.0000 mg | ORAL_TABLET | Freq: Three times a day (TID) | ORAL | 2 refills | Status: DC

## 2020-05-11 NOTE — Progress Notes (Signed)
BH MD/PA/NP OP Progress Note Virtual Visit via Telephone Note  I connected with Chelsea Blake on 05/11/20 at 10:30 AM EDT by telephone and verified that I am speaking with the correct person using two identifiers.  Location: Patient: home Provider: Clinic   I discussed the limitations, risks, security and privacy concerns of performing an evaluation and management service by telephone and the availability of in person appointments. I also discussed with the patient that there may be a patient responsible charge related to this service. The patient expressed understanding and agreed to proceed.   I provided 30 minutes of non-face-to-face time during this encounter.   05/11/2020 11:05 AM Chelsea Blake  MRN:  [id number]  Chief Complaint: "Im a little better. I got a new job"  HPI: 50 year old female seen today for follow up psychiatric evaluation.    She has a psychiatric history of ADHD, alcohol dependence, cocaine dependence, stimulant dependence, nicotine dependence, bipolar disorder, and schizophrenia (patient notes that she received this diagnosis  In prison however provider unable to locate past records).  She is currently Seroquel 25 mg twice daily, Seroquel 150 nightly, and  Buspar 10 mg three times daily. She notes her medications are somewhat effective in managing her psychiatric conditions.  Today she was unable to login virtually so her assessment was done over the phone. During exam she was pleasant, cooperative, and engaged in conversation. She informed provider that since her last visit her anxiety and depression have improved. She notes that she got a new job and is finding more enjoyment in life. She notes however she is more anxious at home noting that she has stressors in her relationship. Today provider conducted a GAD 7 and patient scored a 12, at her last visit she scored a 20.   Provider also conducted a PHQ 9 and patient scored a 1. Due to the above  stressors patient notes that she uses cocaine a few times a week.   Today she notes that her sleep has somewhat improved since being on Seroquel but notes that she wakes up throughout the night. She notes in the past Trazodone was effective in managing her sleep. Patient denies SI/HI/VH or mania. Patient notes that her AH has improved but notes she hears voices occasionally (notes could be voices in the hotel she live in). She endorses having a good appetite.  Patient notes that she continues to work at the [employer]. She also notes that recently she started working at the [employer] and finds enjoyment in her jobs. She notes however that taking Seroquel in the daytime maybe overly sedating while at work.     Today patient is agreeable to discontinue Seroquel 25 mg twice daily. She She will start hydroxyzine 10 mg thee times daily to help manage anxiety. She will also increase Buspar 10 mg to 15 mg three times daily to help mange anxiety and depression. She will start trazodone 50 mg nightly as needed for sleep. Potential side effects of medication and risks vs benefits of treatment vs non-treatment were explained and discussed. All questions were answered. She will follow up with outpatient counselor for therapy. No other concerns noted at this time.  Visit Diagnosis:    ICD-10-CM   1. Schizoaffective disorder, bipolar type (HCC)  F25.0 QUEtiapine (SEROQUEL) 100 MG tablet    traZODone (DESYREL) 50 MG tablet  2. Generalized anxiety disorder  F41.1 busPIRone (BUSPAR) 15 MG tablet    hydrOXYzine (ATARAX/VISTARIL) 10 MG tablet  Past Psychiatric History: ADHD, alcohol dependence, cocaine dependence, stimulant dependence, nicotine dependence, bipolar disorder, and schizophrenia (patient notes that she received this diagnosis  In prison however provider unable to locate past records).   Past Medical History: History reviewed. No pertinent past medical history. History reviewed. No pertinent  surgical history.  Family Psychiatric History: Sister eating disorder, maternal uncle schizophrenia, father alcohol use  Family History:  Family History  Problem Relation Age of Onset  . Arthritis Father   . Throat cancer Other   . Brain cancer Other   . Breast cancer Other   . Hypertension Other     Social History:  Social History   Socioeconomic History  . Marital status: Divorced    Spouse name: Not on file  . Number of children: Not on file  . Years of education: Not on file  . Highest education level: Not on file  Occupational History  . Not on file  Tobacco Use  . Smoking status: Current Every Day Smoker    Packs/day: 1.00    Types: Cigarettes  . Smokeless tobacco: Former Engineer, water and Sexual Activity  . Alcohol use: Yes    Alcohol/week: 0.0 standard drinks    Comment: socially 1 beer a month  . Drug use: Yes    Types: Cocaine    Comment: crack, heroin- pt did not confirm or deny types of substances used when asked   . Sexual activity: Not Currently  Other Topics Concern  . Not on file  Social History Narrative  . Not on file   Social Determinants of Health   Financial Resource Strain: Not on file  Food Insecurity: Not on file  Transportation Needs: Not on file  Physical Activity: Not on file  Stress: Not on file  Social Connections: Not on file    Allergies:  Allergies  Allergen Reactions  . Codeine Itching    Metabolic Disorder Labs: No results found for: HGBA1C, MPG No results found for: PROLACTIN Lab Results  Component Value Date   CHOL 195 05/16/2015   TRIG 164 (H) 05/16/2015   HDL 71 05/16/2015   CHOLHDL 2.7 05/16/2015   VLDL 33 (H) 05/16/2015   LDLCALC 91 05/16/2015   Lab Results  Component Value Date   TSH 1.180 03/23/2015    Therapeutic Level Labs: No results found for: LITHIUM No results found for: VALPROATE No components found for:  CBMZ  Current Medications: Current Outpatient Medications  Medication Sig  Dispense Refill  . hydrOXYzine (ATARAX/VISTARIL) 10 MG tablet Take 1 tablet (10 mg total) by mouth 3 (three) times daily as needed. 90 tablet 2  . traZODone (DESYREL) 50 MG tablet Take 1 tablet (50 mg total) by mouth at bedtime as needed for sleep. 30 tablet 2  . busPIRone (BUSPAR) 15 MG tablet Take 1 tablet (15 mg total) by mouth 3 (three) times daily. 90 tablet 2  . QUEtiapine (SEROQUEL) 100 MG tablet Take 1.5 tablets (150 mg total) by mouth at bedtime. 45 tablet 2   No current facility-administered medications for this visit.     Musculoskeletal: Strength & Muscle Tone: Unable to assess due to telephone visit Gait & Station: Unable to assess due to telephone visit Patient leans: N/A  Psychiatric Specialty Exam: Review of Systems  There were no vitals taken for this visit.There is no height or weight on file to calculate BMI.  General Appearance: Unable to assess due to telephone visit  Eye Contact:  Unable to assess due to telephone  visit  Speech:  Clear and Coherent and Normal Rate  Volume:  Normal  Mood:  Anxious  Affect:  Appropriate and Congruent  Thought Process:  Coherent, Goal Directed and Linear  Orientation:  Full (Time, Place, and Person)  Thought Content: WDL and Hallucinations: Auditory   Suicidal Thoughts:  No  Homicidal Thoughts:  No  Memory:  Immediate;   Good Recent;   Good Remote;   Good  Judgement:  Good  Insight:  Good  Psychomotor Activity:  Normal  Concentration:  Concentration: Good and Attention Span: Good  Recall:  Good  Fund of Knowledge: Good  Language: Good  Akathisia:  No  Handed:  Right  AIMS (if indicated): Not done  Assets:  Communication Skills Desire for Improvement Financial Resources/Insurance Housing Intimacy Social Support  ADL's:  Intact  Cognition: WNL  Sleep:  Good   Screenings: GAD-7   Flowsheet Row Video Visit from 05/11/2020 in [facility] Telemedicine from 03/04/2020 in Primary Care at  [facility] Office Visit from 02/10/2020 in [facility]  Total GAD-7 Score 12 7 20     PHQ2-9   Flowsheet Row Video Visit from 05/11/2020 in [facility] Telemedicine from 03/04/2020 in Primary Care at [facility] Office Visit from 02/10/2020 in [facility] Office Visit from 05/16/2015 in [facility]  PHQ-2 Total Score 0 0 5 1  PHQ-9 Total Score 1 -- 19 --    Flowsheet Row Video Visit from 05/11/2020 in [facility]  C-SSRS RISK CATEGORY No Risk       Assessment and Plan: Patient endorsees symptoms of anxiety and disturbed sleep. Today patient is agreeable to discontinue Seroquel 25 mg twice daily. She She will start hydroxyzine 10 mg thee times daily to help manage anxiety. She will also increase Buspar 10 mg to 15 mg three times daily to help mange anxiety and depression. She will start trazodone 50 mg nightly as needed for sleep.   1. Schizoaffective disorder, bipolar type (HCC)  Continue- QUEtiapine (SEROQUEL) 100 MG tablet; Take 1.5 tablets (150 mg total) by mouth at bedtime.  Dispense: 45 tablet; Refill: 2 Start- traZODone (DESYREL) 50 MG tablet; Take 1 tablet (50 mg total) by mouth at bedtime as needed for sleep.  Dispense: 30 tablet; Refill: 2  2. Generalized anxiety disorder  Increased- busPIRone (BUSPAR) 15 MG tablet; Take 1 tablet (15 mg total) by mouth 3 (three) times daily.  Dispense: 90 tablet; Refill: 2 Start- hydrOXYzine (ATARAX/VISTARIL) 10 MG tablet; Take 1 tablet (10 mg total) by mouth 3 (three) times daily as needed.  Dispense: 90 tablet; Refill: 2  Follow up in 3 months Follow up with therapy [facility], NP 05/11/2020, 11:05 AM

## 2020-06-20 ENCOUNTER — Other Ambulatory Visit: Payer: Self-pay

## 2020-06-20 MED FILL — Buspirone HCl Tab 15 MG: ORAL | 30 days supply | Qty: 90 | Fill #0 | Status: AC

## 2020-06-20 MED FILL — Trazodone HCl Tab 50 MG: ORAL | 30 days supply | Qty: 30 | Fill #0 | Status: AC

## 2020-06-20 MED FILL — Quetiapine Fumarate Tab 100 MG: ORAL | 30 days supply | Qty: 45 | Fill #0 | Status: AC

## 2020-06-20 MED FILL — Hydroxyzine HCl Tab 10 MG: ORAL | 30 days supply | Qty: 90 | Fill #0 | Status: AC

## 2020-06-29 ENCOUNTER — Ambulatory Visit: Payer: Self-pay

## 2020-07-18 NOTE — Progress Notes (Signed)
Patient did not show for appointment.   

## 2020-07-19 ENCOUNTER — Encounter: Payer: Self-pay | Admitting: Family

## 2020-07-19 DIAGNOSIS — Z1329 Encounter for screening for other suspected endocrine disorder: Secondary | ICD-10-CM

## 2020-07-19 DIAGNOSIS — Z13220 Encounter for screening for lipoid disorders: Secondary | ICD-10-CM

## 2020-07-19 DIAGNOSIS — Z130 Encounter for screening for diseases of the blood and blood-forming organs and certain disorders involving the immune mechanism: Secondary | ICD-10-CM

## 2020-07-19 DIAGNOSIS — Z1211 Encounter for screening for malignant neoplasm of colon: Secondary | ICD-10-CM

## 2020-07-19 DIAGNOSIS — Z13228 Encounter for screening for other metabolic disorders: Secondary | ICD-10-CM

## 2020-07-19 DIAGNOSIS — Z131 Encounter for screening for diabetes mellitus: Secondary | ICD-10-CM

## 2020-07-19 DIAGNOSIS — Z124 Encounter for screening for malignant neoplasm of cervix: Secondary | ICD-10-CM

## 2020-07-19 DIAGNOSIS — Z0000 Encounter for general adult medical examination without abnormal findings: Secondary | ICD-10-CM

## 2020-08-01 ENCOUNTER — Other Ambulatory Visit: Payer: Self-pay

## 2020-08-01 MED FILL — Buspirone HCl Tab 15 MG: ORAL | 30 days supply | Qty: 90 | Fill #1 | Status: CN

## 2020-08-01 MED FILL — Quetiapine Fumarate Tab 100 MG: ORAL | 30 days supply | Qty: 45 | Fill #1 | Status: CN

## 2020-08-01 MED FILL — Trazodone HCl Tab 50 MG: ORAL | 30 days supply | Qty: 30 | Fill #1 | Status: CN

## 2020-08-05 ENCOUNTER — Other Ambulatory Visit (HOSPITAL_COMMUNITY): Payer: Self-pay

## 2020-08-08 ENCOUNTER — Encounter (HOSPITAL_COMMUNITY): Payer: Self-pay | Admitting: Psychiatry

## 2020-08-08 ENCOUNTER — Other Ambulatory Visit: Payer: Self-pay

## 2020-08-08 ENCOUNTER — Telehealth (INDEPENDENT_AMBULATORY_CARE_PROVIDER_SITE_OTHER): Payer: No Payment, Other | Admitting: Psychiatry

## 2020-08-08 DIAGNOSIS — F411 Generalized anxiety disorder: Secondary | ICD-10-CM | POA: Diagnosis not present

## 2020-08-08 DIAGNOSIS — F250 Schizoaffective disorder, bipolar type: Secondary | ICD-10-CM

## 2020-08-08 MED ORDER — BUSPIRONE HCL 30 MG PO TABS
30.0000 mg | ORAL_TABLET | Freq: Two times a day (BID) | ORAL | 2 refills | Status: DC
  Filled 2020-08-08: qty 60, 30d supply, fill #0
  Filled 2020-09-20: qty 60, 30d supply, fill #1

## 2020-08-08 MED ORDER — QUETIAPINE FUMARATE 100 MG PO TABS
ORAL_TABLET | ORAL | 2 refills | Status: DC
  Filled 2020-09-20: qty 45, 30d supply, fill #0
  Filled 2020-11-10 – 2021-02-08 (×5): qty 45, 30d supply, fill #1

## 2020-08-08 MED ORDER — TRAZODONE HCL 50 MG PO TABS
ORAL_TABLET | Freq: Every evening | ORAL | 2 refills | Status: DC | PRN
  Filled 2020-09-20: qty 30, 30d supply, fill #0
  Filled 2020-11-10 – 2021-02-08 (×5): qty 30, 30d supply, fill #1

## 2020-08-08 MED FILL — Buspirone HCl Tab 15 MG: ORAL | 30 days supply | Qty: 90 | Fill #1 | Status: CN

## 2020-08-08 MED FILL — Trazodone HCl Tab 50 MG: ORAL | 30 days supply | Qty: 30 | Fill #1 | Status: AC

## 2020-08-08 MED FILL — Quetiapine Fumarate Tab 100 MG: ORAL | 30 days supply | Qty: 45 | Fill #1 | Status: AC

## 2020-08-08 NOTE — Progress Notes (Signed)
BH MD/PA/NP OP Progress Note Virtual Visit via Telephone Note  I connected with [name] on 08/08/20 at 10:00 AM EDT by telephone and verified that I am speaking with the correct person using two identifiers.  Location: Patient: home Provider: Clinic   I discussed the limitations, risks, security and privacy concerns of performing an evaluation and management service by telephone and the availability of in person appointments. I also discussed with the patient that there may be a patient responsible charge related to this service. The patient expressed understanding and agreed to proceed.   I provided 30 minutes of non-face-to-face time during this encounter.   08/08/2020 1:35 PM Chelsea Blake  MRN:  [id number]  Chief Complaint: "Im okay but I think I have covid"  HPI: 50 year old female seen today for follow up psychiatric evaluation.    She has a psychiatric history of ADHD, alcohol dependence, cocaine dependence, stimulant dependence, nicotine dependence, bipolar disorder, and schizophrenia.  She is currently Seroquel 150 nightly, hydroxyzine 10 mg three times daily as needed, and  Buspar 15 mg three times daily. She notes her medications are somewhat effective in managing her psychiatric conditions.  Patient informed provider that she disliked hydroxyzine noting that it makes her legs feel heavy.  Today she was unable to login virtually so her assessment was done over the phone. During exam she was pleasant, cooperative, and engaged in conversation. She informed provider that mentally she feels somewhat better but notes that she  believes she has covid 19 and physically not feeling well.  She notes that her anxiety and depression has somewhat improved however reports that she would like to increase her BuSpar to help manage it a little more.  Today provider conducted a GAD-7 and patient scored a 4, at her last visit she scored a 12.  Provider also conducted a  PHQ-9 and patient scored a 7, at her last visit she scored a 1.  She notes that she does not have hallucinations when she is sober however notes that when she consumes cocaine she feels paranoid and has hallucinations.  Today she denies SI/HI, symptoms of mania, or paranoia.  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 She notes she did not want that position and reports that she quit. She continues to work at the [employer] and notes that she finds enjoyment in her work.  Patient informed provider that she has been having pains in her legs which she attributes to hydroxyzine.  She describes her pain as a muscle cramp.  Provider informed patient that hydroxyzine generally does not cause muscle pain and asked patient if she drinks water.  She notes that she is trying to drink more water.  Provider informed patient to discuss her concerns with her PCP and recommended taking a multivitamin daily.  She endorsed understanding and agreed.  Today she is agreeable to increasing BuSpar 15 mg 3 times daily to 30 mg twice daily to help manage anxiety and depression.  Patient will discontinue hydroxyzine at this time noting that it causes her legs to feel heavy. She will continue all other medications as prescribed.  She will follow-up with outpatient counseling for therapy.  no other concerns noted at this time.  Visit Diagnosis:    ICD-10-CM   1. Generalized anxiety disorder  F41.1 busPIRone (BUSPAR) 30 MG tablet    2. Schizoaffective disorder,  bipolar type (HCC)  F25.0 traZODone (DESYREL) 50 MG tablet    QUEtiapine (SEROQUEL) 100 MG tablet      Past Psychiatric History: ADHD, alcohol dependence, cocaine dependence, stimulant dependence, nicotine dependence, bipolar disorder, and schizophrenia (patient notes that she received this diagnosis  In prison however provider unable to locate  past records).   Past Medical History: History reviewed. No pertinent past medical history. History reviewed. No pertinent surgical history.  Family Psychiatric History: Sister eating disorder, maternal uncle schizophrenia, father alcohol use  Family History:  Family History  Problem Relation Age of Onset   Arthritis Father    Throat cancer Other    Brain cancer Other    Breast cancer Other    Hypertension Other     Social History:  Social History   Socioeconomic History   Marital status: Divorced    Spouse name: Not on file   Number of children: Not on file   Years of education: Not on file   Highest education level: Not on file  Occupational History   Not on file  Tobacco Use   Smoking status: Every Day    Packs/day: 1.00    Pack years: 0.00    Types: Cigarettes   Smokeless tobacco: Former  Substance and Sexual Activity   Alcohol use: Yes    Alcohol/week: 0.0 standard drinks    Comment: socially 1 beer a month   Drug use: Yes    Types: Cocaine    Comment: crack, heroin- pt did not confirm or deny types of substances used when asked    Sexual activity: Not Currently  Other Topics Concern   Not on file  Social History Narrative   Not on file   Social Determinants of Health   Financial Resource Strain: Not on file  Food Insecurity: Not on file  Transportation Needs: Not on file  Physical Activity: Not on file  Stress: Not on file  Social Connections: Not on file    Allergies:  Allergies  Allergen Reactions   Codeine Itching    Metabolic Disorder Labs: No results found for: HGBA1C, MPG No results found for: PROLACTIN Lab Results  Component Value Date   CHOL 195 05/16/2015   TRIG 164 (H) 05/16/2015   HDL 71 05/16/2015   CHOLHDL 2.7 05/16/2015   VLDL 33 (H) 05/16/2015   LDLCALC 91 05/16/2015   Lab Results  Component Value Date   TSH 1.180 03/23/2015    Therapeutic Level Labs: No results found for: LITHIUM No results found for: VALPROATE No  components found for:  CBMZ  Current Medications: Current Outpatient Medications  Medication Sig Dispense Refill   busPIRone (BUSPAR) 30 MG tablet Take 1 tablet (30 mg total) by mouth 2 (two) times daily. 60 tablet 2   QUEtiapine (SEROQUEL) 100 MG tablet TAKE 1.5 TABLETS (150 MG TOTAL) BY MOUTH AT BEDTIME. 45 tablet 2   traZODone (DESYREL) 50 MG tablet TAKE 1 TABLET (50 MG TOTAL) BY MOUTH AT BEDTIME AS NEEDED FOR SLEEP. 30 tablet 2   No current facility-administered medications for this visit.     Musculoskeletal: Strength & Muscle Tone:  Unable to assess due to telephone visit Gait & Station:  Unable to assess due to telephone visit Patient leans: N/A  Psychiatric Specialty Exam: Review of Systems  There were no vitals taken for this visit.There is no height or weight on file to calculate BMI.  General Appearance:  Unable to assess due to telephone visit  Eye Contact:  Unable to assess due to telephone visit  Speech:  Clear and Coherent and Normal Rate  Volume:  Normal  Mood:  Euthymic and notes that her anxiety and depression can be improved  Affect:  Appropriate and Congruent  Thought Process:  Coherent, Goal Directed and Linear  Orientation:  Full (Time, Place, and Person)  Thought Content: WDL, Hallucinations: Auditory Reports has hallucinations when she uses cocaine, and denies current hallucinations    Suicidal Thoughts:  No  Homicidal Thoughts:  No  Memory:  Immediate;   Good Recent;   Good Remote;   Good  Judgement:  Good  Insight:  Good  Psychomotor Activity:  Normal  Concentration:  Concentration: Good and Attention Span: Good  Recall:  Good  Fund of Knowledge: Good  Language: Good  Akathisia:  No  Handed:  Right  AIMS (if indicated): Not done  Assets:  Communication Skills Desire for Improvement Financial Resources/Insurance Housing Intimacy Social Support  ADL's:  Intact  Cognition: WNL  Sleep:  Good   Screenings: GAD-7    Flowsheet Row Video  Visit from 08/08/2020 in [facility] Video Visit from 05/11/2020 in [facility] Telemedicine from 03/04/2020 in Primary Care at [facility] Office Visit from 02/10/2020 in [facility]  Total GAD-7 Score 4 12 7 20       PHQ2-9    Flowsheet Row Video Visit from 08/08/2020 in [facility] Video Visit from 05/11/2020 in [facility] Telemedicine from 03/04/2020 in Primary Care at [facility] Office Visit from 02/10/2020 in [facility] Office Visit from 05/16/2015 in [facility]  PHQ-2 Total Score 0 0 0 5 1  PHQ-9 Total Score 7 1 -- 19 --      Flowsheet Row Video Visit from 05/11/2020 in [facility]  C-SSRS RISK CATEGORY No Risk        Assessment and Plan: Patient notes that her anxiety and depression has somewhat improved however she informed writer that she feels it can be better.  Today she is agreeable to increasing BuSpar 15 mg 3 times daily to 30 mg twice daily to help manage anxiety and depression.  She informed provider that she disliked hydroxyzine and reports that she would like to discontinue today.  She will continue all other medication as prescribed.    1. Generalized anxiety disorder  Increase- busPIRone (BUSPAR) 30 MG tablet; Take 1 tablet (30 mg total) by mouth 2 (two) times daily.  Dispense: 60 tablet; Refill: 2  2. Schizoaffective disorder, bipolar type (HCC)  Continue- traZODone (DESYREL) 50 MG tablet; TAKE 1 TABLET (50 MG TOTAL) BY MOUTH AT BEDTIME AS NEEDED FOR SLEEP.  Dispense: 30 tablet; Refill: 2 Increased- QUEtiapine (SEROQUEL) 100 MG tablet; TAKE 1.5 TABLETS (150 MG TOTAL) BY MOUTH AT BEDTIME.  Dispense: 45 tablet; Refill: 2   Follow up in 3 months Follow up with therapy [facility], NP 08/08/2020, 1:35 PM

## 2020-09-20 ENCOUNTER — Other Ambulatory Visit: Payer: Self-pay

## 2020-09-22 ENCOUNTER — Other Ambulatory Visit (HOSPITAL_COMMUNITY): Payer: Self-pay | Admitting: Psychiatry

## 2020-09-22 ENCOUNTER — Other Ambulatory Visit: Payer: Self-pay

## 2020-09-22 DIAGNOSIS — F411 Generalized anxiety disorder: Secondary | ICD-10-CM

## 2020-09-22 MED ORDER — BUSPIRONE HCL 30 MG PO TABS
30.0000 mg | ORAL_TABLET | Freq: Two times a day (BID) | ORAL | 2 refills | Status: DC
  Filled 2020-09-22 – 2021-02-08 (×6): qty 60, 30d supply, fill #0

## 2020-09-23 ENCOUNTER — Other Ambulatory Visit: Payer: Self-pay

## 2020-11-07 ENCOUNTER — Telehealth (HOSPITAL_COMMUNITY): Payer: Self-pay | Admitting: Psychiatry

## 2020-11-10 ENCOUNTER — Other Ambulatory Visit: Payer: Self-pay

## 2020-11-17 ENCOUNTER — Other Ambulatory Visit: Payer: Self-pay

## 2020-12-14 ENCOUNTER — Telehealth: Payer: Self-pay | Admitting: Family

## 2020-12-14 ENCOUNTER — Other Ambulatory Visit: Payer: Self-pay

## 2020-12-14 NOTE — Telephone Encounter (Signed)
Copied from CRM [id number]. Topic: General - Other >> Dec 14, 2020  1:52 PM Traci Sermon wrote: Reason for CRM: Pt called in stating she has a new insurance and needs a print out of her medications and what and how she takes them, please advise.

## 2020-12-21 ENCOUNTER — Other Ambulatory Visit: Payer: Self-pay

## 2021-01-10 ENCOUNTER — Other Ambulatory Visit: Payer: Self-pay

## 2021-01-16 ENCOUNTER — Other Ambulatory Visit: Payer: Self-pay

## 2021-01-17 ENCOUNTER — Other Ambulatory Visit: Payer: Self-pay

## 2021-01-27 ENCOUNTER — Other Ambulatory Visit: Payer: Self-pay

## 2021-02-03 ENCOUNTER — Other Ambulatory Visit: Payer: Self-pay

## 2021-02-08 ENCOUNTER — Other Ambulatory Visit: Payer: Self-pay

## 2021-02-09 ENCOUNTER — Other Ambulatory Visit: Payer: Self-pay

## 2021-04-17 ENCOUNTER — Other Ambulatory Visit (HOSPITAL_COMMUNITY): Payer: Self-pay | Admitting: Psychiatry

## 2021-04-17 ENCOUNTER — Other Ambulatory Visit: Payer: Self-pay

## 2021-04-17 ENCOUNTER — Telehealth (HOSPITAL_COMMUNITY): Payer: Self-pay | Admitting: Psychiatry

## 2021-04-17 DIAGNOSIS — F250 Schizoaffective disorder, bipolar type: Secondary | ICD-10-CM

## 2021-04-17 DIAGNOSIS — F411 Generalized anxiety disorder: Secondary | ICD-10-CM

## 2021-04-17 MED ORDER — TRAZODONE HCL 50 MG PO TABS
ORAL_TABLET | Freq: Every evening | ORAL | 2 refills | Status: DC | PRN
  Filled 2021-04-17 – 2021-05-26 (×2): qty 30, 30d supply, fill #0
  Filled 2021-07-06: qty 30, 30d supply, fill #1

## 2021-04-17 MED ORDER — QUETIAPINE FUMARATE 100 MG PO TABS
ORAL_TABLET | ORAL | 2 refills | Status: DC
  Filled 2021-04-17: qty 45, 30d supply, fill #0

## 2021-04-17 MED ORDER — BUSPIRONE HCL 30 MG PO TABS
30.0000 mg | ORAL_TABLET | Freq: Two times a day (BID) | ORAL | 2 refills | Status: DC
  Filled 2021-04-17 – 2021-05-26 (×2): qty 60, 30d supply, fill #0
  Filled 2021-07-06: qty 60, 30d supply, fill #1

## 2021-04-17 NOTE — Telephone Encounter (Signed)
Provider refilled BuSpar, trazodone, and Seroquel.  Patient will need to follow-up for future refills.  Writer attempted to call patient twice without success.  Patient have questions or concerns regarding refills please feel free to call or walk into clinic.

## 2021-04-17 NOTE — Telephone Encounter (Signed)
Patient contacted office to have medications refilled. Patient has not been seen since June 2022. Writer informed patient that this request may or may not be honored, Consulting civil engineer. Patient is aware of next appt and unable to walk-in due to lack of transportation.

## 2021-04-24 ENCOUNTER — Other Ambulatory Visit: Payer: Self-pay

## 2021-05-05 ENCOUNTER — Telehealth (HOSPITAL_COMMUNITY): Payer: No Payment, Other | Admitting: Psychiatry

## 2021-05-08 ENCOUNTER — Telehealth (INDEPENDENT_AMBULATORY_CARE_PROVIDER_SITE_OTHER): Payer: No Payment, Other | Admitting: Psychiatry

## 2021-05-08 ENCOUNTER — Other Ambulatory Visit: Payer: Self-pay

## 2021-05-08 DIAGNOSIS — F4310 Post-traumatic stress disorder, unspecified: Secondary | ICD-10-CM

## 2021-05-08 DIAGNOSIS — F411 Generalized anxiety disorder: Secondary | ICD-10-CM

## 2021-05-08 DIAGNOSIS — F250 Schizoaffective disorder, bipolar type: Secondary | ICD-10-CM

## 2021-05-08 MED ORDER — QUETIAPINE FUMARATE 200 MG PO TABS
200.0000 mg | ORAL_TABLET | Freq: Every day | ORAL | 2 refills | Status: DC
  Filled 2021-05-08 – 2021-05-26 (×2): qty 30, 30d supply, fill #0
  Filled 2021-07-06: qty 30, 30d supply, fill #1

## 2021-05-08 NOTE — Progress Notes (Signed)
BH MD/PA/NP OP Progress Note  05/08/2021 3:42 PM Chelsea Blake  MRN:  [id number]  Virtual Visit via Telephone Note  I connected with Chelsea Blake on 05/08/21 at  3:30 PM EDT by telephone and verified that I am speaking with the correct person using two identifiers.  Location: Patient: home Provider: off site   I discussed the limitations, risks, security and privacy concerns of performing an evaluation and management service by telephone and the availability of in person appointments. I also discussed with the patient that there may be a patient responsible charge related to this service. The patient expressed understanding and agreed to proceed.    I discussed the assessment and treatment plan with the patient. The patient was provided an opportunity to ask questions and all were answered. The patient agreed with the plan and demonstrated an understanding of the instructions.   The patient was advised to call back or seek an in-person evaluation if the symptoms worsen or if the condition fails to improve as anticipated.  I provided 15 minutes of non-face-to-face time during this encounter.   Franne Grip, NP   Chief Complaint: Medication management  HPI: Chelsea Blake is a 51 year old female presenting to [facility] behavioral health outpatient for follow-up psychiatric evaluation.  She has a psychiatric history of PTSD, generalized anxiety disorder, and schizoaffective disorder, bipolar type.  Her symptoms are managed with BuSpar 30 mg, Seroquel 200 mg, trazodone 50 mg.  She reports medication compliance and denies adverse medication reactions.    Patient is alert and oriented x4, calm, pleasant and willing to engage.Patient reports that she continues to experience auditory and visual hallucinations.  Patient reports hearing voices of people she thinks she knew and seeing shadows.  Patient reports good mood, sleep and appetite.  She denies suicidal  homicidal ideations, paranoia, delusional thought.      Visit Diagnosis:    ICD-10-CM   1. Schizoaffective disorder, bipolar type ([place])  F25.0 QUEtiapine (SEROQUEL) 200 MG tablet    2. Generalized anxiety disorder  F41.1     3. PTSD (post-traumatic stress disorder)  F43.10       Past Psychiatric History: Generalized anxiety disorder, PTSD, schizoaffective disorder, bipolar type.  Past Medical History: No past medical history on file. No past surgical history on file.  Family Psychiatric History: None known  Family History:  Family History  Problem Relation Age of Onset   Arthritis Father    Throat cancer Other    Brain cancer Other    Breast cancer Other    Hypertension Other     Social History:  Social History   Socioeconomic History   Marital status: Divorced    Spouse name: Not on file   Number of children: Not on file   Years of education: Not on file   Highest education level: Not on file  Occupational History   Not on file  Tobacco Use   Smoking status: Every Day    Packs/day: 1.00    Types: Cigarettes   Smokeless tobacco: Former  Substance and Sexual Activity   Alcohol use: Yes    Alcohol/week: 0.0 standard drinks    Comment: socially 1 beer a month   Drug use: Yes    Types: Cocaine    Comment: crack, heroin- pt did not confirm or deny types of substances used when asked    Sexual activity: Not Currently  Other Topics Concern   Not on file  Social History Narrative   Not on  file   Social Determinants of Health   Financial Resource Strain: Not on file  Food Insecurity: Not on file  Transportation Needs: Not on file  Physical Activity: Not on file  Stress: Not on file  Social Connections: Not on file    Allergies:  Allergies  Allergen Reactions   Codeine Itching    Metabolic Disorder Labs: No results found for: HGBA1C, MPG No results found for: PROLACTIN Lab Results  Component Value Date   CHOL 195 05/16/2015   TRIG 164 (H)  05/16/2015   HDL 71 05/16/2015   CHOLHDL 2.7 05/16/2015   VLDL 33 (H) 05/16/2015   LDLCALC 91 05/16/2015   Lab Results  Component Value Date   TSH 1.180 03/23/2015    Therapeutic Level Labs: No results found for: LITHIUM No results found for: VALPROATE No components found for:  CBMZ  Current Medications: Current Outpatient Medications  Medication Sig Dispense Refill   busPIRone (BUSPAR) 30 MG tablet Take 1 tablet (30 mg total) by mouth 2 (two) times daily. 60 tablet 2   QUEtiapine (SEROQUEL) 200 MG tablet Take 1 tablet (200 mg total) by mouth at bedtime. 30 tablet 2   traZODone (DESYREL) 50 MG tablet TAKE 1 TABLET (50 MG TOTAL) BY MOUTH AT BEDTIME AS NEEDED FOR SLEEP. 30 tablet 2   No current facility-administered medications for this visit.     Musculoskeletal: Strength & Muscle Tone: N/A virtual visit in [place]: In a Patient leans: N/A  Psychiatric Specialty Exam: Review of Systems  Psychiatric/Behavioral:  Positive for hallucinations. Negative for self-injury and suicidal ideas.   All other systems reviewed and are negative.  There were no vitals taken for this visit.There is no height or weight on file to calculate BMI.  General Appearance: N/A  Eye Contact: N/A  Speech: Clear and coherent  Volume: Normal  Mood: Euthymic  Affect: N/A  Thought Process: Goal directed  Orientation:  Full (Time, Place, and Person)  Thought Content: Logical  Suicidal Thoughts: No  Homicidal Thoughts: No  Memory: Good  Judgement: Good  Insight: Good  Psychomotor Activity: N/A  Concentration: Good  Recall: Good  Fund of Knowledge: Good  Language: Good  Akathisia: N/A  Handed: Right  AIMS (if indicated): Not done  Assets:  Communication Skills Desire for Improvement  ADL's:  Intact  Cognition: WNL  Sleep:  Good   Screenings: GAD-7    Flowsheet Row Video Visit from 08/08/2020 in [facility] Video Visit from 05/11/2020 in [place] from 03/04/2020 in Primary Care at [place] Visit from 02/10/2020 in [facility]  Total GAD-7 Score 4 12 7 20       PHQ2-9    Flowsheet Row Video Visit from 08/08/2020 in [facility] Video Visit from 05/11/2020 in [place] from 03/04/2020 in Primary Care at [place] Visit from 02/10/2020 in [facility] Office Visit from 05/16/2015 in [place]  PHQ-2 Total Score 0 0 0 5 1  PHQ-9 Total Score 7 1 -- 19 --      Flowsheet Row Video Visit from 05/11/2020 in [place] No Risk        Assessment and Plan: Chelsea Blake is a 51 year old female presenting to [facility] behavioral health outpatient for follow-up psychiatric evaluation.  She has a psychiatric history of PTSD,  generalized anxiety disorder, and schizoaffective disorder, bipolar type.  Her symptoms are managed with BuSpar 30 mg, Seroquel 200 mg, trazodone 50 mg.  She reports medication compliance and denies adverse medication reactions.  Patient reports that she continues to experience auditory and visual hallucinations.  Patient reports hearing voices of people she thinks she knew and seeing shadows.  Patient in agreement with increasing Seroquel to 200 mg at bedtime for symptom management.  Medication benefits versus risk discussed.  Collaboration of Care: Collaboration of Care: Medication Management AEB Seroquel 200 mg E scribed to patient's preferred pharmacy.  1. Schizoaffective disorder, bipolar type (HCC)  - QUEtiapine (SEROQUEL) 200 MG tablet; Take 1 tablet (200 mg total) by mouth at bedtime.  Dispense: 30 tablet; Refill: 2  2. Generalized anxiety disorder Continue BuSpar as previously ordered  3. PTSD (post-traumatic stress disorder) Recommend  psychotherapy  Return to care in 3 months  follow-up with therapy referral  Patient/Guardian was advised Release of Information must be obtained prior to any record release in order to collaborate their care with an outside provider. Patient/Guardian was advised if they have not already done so to contact the registration department to sign all necessary forms in order for [place] to release information regarding their care.   Consent: Patient/Guardian gives verbal consent for treatment and assignment of benefits for services provided during this visit. Patient/Guardian expressed understanding and agreed to proceed.    Franne Grip, NP 05/08/2021, 3:42 PM

## 2021-05-09 ENCOUNTER — Ambulatory Visit (HOSPITAL_COMMUNITY): Payer: No Payment, Other | Admitting: Clinical

## 2021-05-09 ENCOUNTER — Encounter (HOSPITAL_COMMUNITY): Payer: Self-pay

## 2021-05-15 ENCOUNTER — Other Ambulatory Visit: Payer: Self-pay

## 2021-05-26 ENCOUNTER — Other Ambulatory Visit: Payer: Self-pay

## 2021-05-30 ENCOUNTER — Other Ambulatory Visit: Payer: Self-pay

## 2021-07-06 ENCOUNTER — Other Ambulatory Visit: Payer: Self-pay

## 2021-07-13 ENCOUNTER — Other Ambulatory Visit: Payer: Self-pay

## 2021-08-08 ENCOUNTER — Encounter (HOSPITAL_COMMUNITY): Payer: Self-pay | Admitting: Psychiatry

## 2021-08-08 ENCOUNTER — Telehealth (INDEPENDENT_AMBULATORY_CARE_PROVIDER_SITE_OTHER): Payer: No Payment, Other | Admitting: Psychiatry

## 2021-08-08 ENCOUNTER — Other Ambulatory Visit: Payer: Self-pay

## 2021-08-08 DIAGNOSIS — F411 Generalized anxiety disorder: Secondary | ICD-10-CM

## 2021-08-08 DIAGNOSIS — F250 Schizoaffective disorder, bipolar type: Secondary | ICD-10-CM | POA: Diagnosis not present

## 2021-08-08 MED ORDER — BUSPIRONE HCL 30 MG PO TABS
30.0000 mg | ORAL_TABLET | Freq: Two times a day (BID) | ORAL | 3 refills | Status: DC
  Filled 2021-08-08 – 2021-09-08 (×2): qty 60, 30d supply, fill #0
  Filled 2021-09-22: qty 180, 90d supply, fill #0
  Filled 2021-10-03: qty 60, 30d supply, fill #0

## 2021-08-08 MED ORDER — TRAZODONE HCL 50 MG PO TABS
ORAL_TABLET | Freq: Every evening | ORAL | 2 refills | Status: DC | PRN
  Filled 2021-08-08 – 2021-10-03 (×4): qty 30, 30d supply, fill #0

## 2021-08-08 MED ORDER — FLUOXETINE HCL 10 MG PO CAPS
10.0000 mg | ORAL_CAPSULE | Freq: Every day | ORAL | 3 refills | Status: DC
  Filled 2021-08-08 – 2021-10-03 (×4): qty 30, 30d supply, fill #0

## 2021-08-08 MED ORDER — QUETIAPINE FUMARATE 50 MG PO TABS
150.0000 mg | ORAL_TABLET | Freq: Every day | ORAL | 3 refills | Status: DC
  Filled 2021-08-08 – 2021-09-08 (×2): qty 30, 30d supply, fill #0
  Filled 2021-09-22 – 2021-10-03 (×2): qty 90, 30d supply, fill #0

## 2021-08-08 NOTE — Progress Notes (Signed)
BH MD/PA/NP OP Progress Note Virtual Visit via Telephone Note  I connected with Chelsea Blake on 08/08/21 at  1:00 PM EDT by telephone and verified that I am speaking with the correct person using two identifiers.  Location: Patient: home Provider: Clinic   I discussed the limitations, risks, security and privacy concerns of performing an evaluation and management service by telephone and the availability of in person appointments. I also discussed with the patient that there may be a patient responsible charge related to this service. The patient expressed understanding and agreed to proceed.   I provided 20 minutes of non-face-to-face time during this encounter.   08/08/2021 1:00 PM Chelsea Blake  MRN:  [id number]  Chief Complaint: "Can you drop the Seroquel down I cant function"  HPI: 51 year old female seen today for follow up psychiatric evaluation.    She has a psychiatric history of ADHD, alcohol dependence, cocaine dependence, stimulant dependence, nicotine dependence, bipolar disorder, and schizophrenia.  She is currently Seroquel 200 nightly, and  Buspar 30 mg twice daily, and trazodone 50 mg nightly as needed. She notes her medications are somewhat effective in managing her psychiatric conditions.   Today she was unable to login virtually so her assessment was done over the phone. During exam she was pleasant, cooperative, and engaged in conversation. She informed provider that she has not been able to function on Seroquel.  She requests that the dose be reduced to 150 mg.  Patient does note that she continues to have auditory hallucinations but notes that she cannot discuss them as she was in the car with other people.  She notes recently her anxiety has been increasingly bad but informed Probation officer that she was not in a good place to talk.  She found hydroxyzine ineffective in the past.  Provider recommended starting an antidepressant to help with anxiety and  depression.  Today she denies SI/HI or mania.  She endorses adequate sleep and appetite.  Today patient agreeable to reducing Seroquel 200 mg to 150 mg.  She will start Prozac 10 mg anxiety and depression.Potential side effects of medication and risks vs benefits of treatment vs non-treatment were explained and discussed. All questions were answered.  She will continue her other medications as prescribed.  She will follow-up with outpatient counseling for therapy.  No other concerns noted at this time.  Visit Diagnosis:    ICD-10-CM   1. Generalized anxiety disorder  F41.1 FLUoxetine (PROZAC) 10 MG capsule    busPIRone (BUSPAR) 30 MG tablet    2. Schizoaffective disorder, bipolar type (HCC)  F25.0 FLUoxetine (PROZAC) 10 MG capsule    QUEtiapine 150 MG TABS    traZODone (DESYREL) 50 MG tablet      Past Psychiatric History: ADHD, alcohol dependence, cocaine dependence, stimulant dependence, nicotine dependence, bipolar disorder, and schizophrenia (patient notes that she received this diagnosis  In prison however provider unable to locate past records).   Past Medical History: No past medical history on file. No past surgical history on file.  Family Psychiatric History: Sister eating disorder, maternal uncle schizophrenia, father alcohol use  Family History:  Family History  Problem Relation Age of Onset   Arthritis Father    Throat cancer Other    Brain cancer Other    Breast cancer Other    Hypertension Other     Social History:  Social History   Socioeconomic History   Marital status: Divorced    Spouse name: Not on file   Number of  children: Not on file   Years of education: Not on file   Highest education level: Not on file  Occupational History   Not on file  Tobacco Use   Smoking status: Every Day    Packs/day: 1.00    Types: Cigarettes   Smokeless tobacco: Former  Substance and Sexual Activity   Alcohol use: Yes    Alcohol/week: 0.0 standard drinks of alcohol     Comment: socially 1 beer a month   Drug use: Yes    Types: Cocaine    Comment: crack, heroin- pt did not confirm or deny types of substances used when asked    Sexual activity: Not Currently  Other Topics Concern   Not on file  Social History Narrative   Not on file   Social Determinants of Health   Financial Resource Strain: Not on file  Food Insecurity: Not on file  Transportation Needs: Not on file  Physical Activity: Not on file  Stress: Not on file  Social Connections: Not on file    Allergies:  Allergies  Allergen Reactions   Codeine Itching    Metabolic Disorder Labs: No results found for: "HGBA1C", "MPG" No results found for: "PROLACTIN" Lab Results  Component Value Date   CHOL 195 05/16/2015   TRIG 164 (H) 05/16/2015   HDL 71 05/16/2015   CHOLHDL 2.7 05/16/2015   VLDL 33 (H) 05/16/2015   LDLCALC 91 05/16/2015   Lab Results  Component Value Date   TSH 1.180 03/23/2015    Therapeutic Level Labs: No results found for: "LITHIUM" No results found for: "VALPROATE" No results found for: "CBMZ"  Current Medications: Current Outpatient Medications  Medication Sig Dispense Refill   FLUoxetine (PROZAC) 10 MG capsule Take 1 capsule (10 mg total) by mouth daily. 30 capsule 3   busPIRone (BUSPAR) 30 MG tablet Take 1 tablet (30 mg total) by mouth 2 (two) times daily. 60 tablet 3   QUEtiapine 150 MG TABS Take 150 mg by mouth at bedtime. 30 tablet 3   traZODone (DESYREL) 50 MG tablet TAKE 1 TABLET (50 MG TOTAL) BY MOUTH AT BEDTIME AS NEEDED FOR SLEEP. 30 tablet 2   No current facility-administered medications for this visit.     Musculoskeletal: Strength & Muscle Tone:  Unable to assess due to telephone visit Gait & Station:  Unable to assess due to telephone visit Patient leans: N/A  Psychiatric Specialty Exam: Review of Systems  There were no vitals taken for this visit.There is no height or weight on file to calculate BMI.  General Appearance:  Unable  to assess due to telephone visit  Eye Contact:   Unable to assess due to telephone visit  Speech:  Clear and Coherent and Normal Rate  Volume:  Normal  Mood:  Anxious and Depressed  Affect:  Appropriate and Congruent  Thought Process:  Coherent, Goal Directed and Linear  Orientation:  Full (Time, Place, and Person)  Thought Content: Logical and Hallucinations: Auditory   Suicidal Thoughts:  No  Homicidal Thoughts:  No  Memory:  Immediate;   Good Recent;   Good Remote;   Good  Judgement:  Good  Insight:  Good  Psychomotor Activity:  Normal  Concentration:  Concentration: Good and Attention Span: Good  Recall:  Good  Fund of Knowledge: Good  Language: Good  Akathisia:  No  Handed:  Right  AIMS (if indicated): Not done  Assets:  Communication Skills Desire for Improvement Financial Resources/Insurance Housing Intimacy Social Support  ADL's:  Intact  Cognition: WNL  Sleep:  Good   Screenings: GAD-7    Flowsheet Row [facility] from [facility]08/2020 [facility] Video Visit from 05/11/2020 in [place] from 03/04/2020 in Primary Care at [place] Visit from 02/10/2020 in [facility]  Total GAD-7 Score 4 12 7 20       PHQ2-9    Flowsheet Row Video Visit from 08/08/2020 in [facility] Video Visit from 05/11/2020 in [place] from 03/04/2020 in Primary Care at [place] Visit from 02/10/2020 in [facility] Office Visit from 05/16/2015 in [place]  PHQ-2 Total Score 0 0 0 5 1  PHQ-9 Total Score 7 1 -- 19 --      Flowsheet Row Video Visit from 05/11/2020 in [place] No Risk        Assessment and Plan: Patient notes that her anxiety and depression has changed since her last visit.  She  also notes that she has difficulty functioning on Seroquel.Today patient agreeable to reducing Seroquel 200 mg to 150 mg.  She will start Prozac 10 mg anxiety and depression.  She will continue her other medications as prescribed.  1. Generalized anxiety disorder  Start- FLUoxetine (PROZAC) 10 MG capsule; Take 1 capsule (10 mg total) by mouth daily.  Dispense: 30 capsule; Refill: 3 Continue- busPIRone (BUSPAR) 30 MG tablet; Take 1 tablet (30 mg total) by mouth 2 (two) times daily.  Dispense: 60 tablet; Refill: 3  2. Schizoaffective disorder, bipolar type ([place])  Start- FLUoxetine (PROZAC) 10 MG capsule; Take 1 capsule (10 mg total) by mouth daily.  Dispense: 30 capsule; Refill: 3 Reduced- QUEtiapine 150 MG TABS; Take 150 mg by mouth at bedtime.  Dispense: 30 tablet; Refill: 3 Continue- traZODone (DESYREL) 50 MG tablet; TAKE 1 TABLET (50 MG TOTAL) BY MOUTH AT BEDTIME AS NEEDED FOR SLEEP.  Dispense: 30 tablet; Refill: 2  Follow up in 3 months Follow up with therapy Salley Slaughter, NP 08/08/2021, 1:00 PM

## 2021-08-09 ENCOUNTER — Other Ambulatory Visit: Payer: Self-pay

## 2021-08-15 ENCOUNTER — Other Ambulatory Visit: Payer: Self-pay

## 2021-08-16 ENCOUNTER — Other Ambulatory Visit: Payer: Self-pay

## 2021-09-08 ENCOUNTER — Other Ambulatory Visit: Payer: Self-pay

## 2021-09-22 ENCOUNTER — Telehealth: Payer: Self-pay | Admitting: Family

## 2021-09-22 ENCOUNTER — Other Ambulatory Visit: Payer: Self-pay

## 2021-09-22 NOTE — Telephone Encounter (Signed)
Copied from CRM [id number]. Topic: General - Other >> Sep 22, 2021  3:09 PM Tiffany B wrote: Reason for CRM: Patient inquiring about [place] financial assistance / orange card.

## 2021-09-25 ENCOUNTER — Other Ambulatory Visit: Payer: Self-pay

## 2021-09-28 ENCOUNTER — Other Ambulatory Visit: Payer: Self-pay

## 2021-10-03 ENCOUNTER — Other Ambulatory Visit: Payer: Self-pay

## 2021-11-09 ENCOUNTER — Other Ambulatory Visit: Payer: Self-pay

## 2021-11-09 ENCOUNTER — Telehealth (INDEPENDENT_AMBULATORY_CARE_PROVIDER_SITE_OTHER): Payer: No Payment, Other | Admitting: Psychiatry

## 2021-11-09 ENCOUNTER — Encounter (HOSPITAL_COMMUNITY): Payer: Self-pay | Admitting: Psychiatry

## 2021-11-09 DIAGNOSIS — F250 Schizoaffective disorder, bipolar type: Secondary | ICD-10-CM

## 2021-11-09 DIAGNOSIS — F411 Generalized anxiety disorder: Secondary | ICD-10-CM | POA: Diagnosis not present

## 2021-11-09 MED ORDER — FLUOXETINE HCL 20 MG PO CAPS
20.0000 mg | ORAL_CAPSULE | Freq: Every day | ORAL | 3 refills | Status: DC
  Filled 2021-11-09 – 2021-12-22 (×2): qty 30, 30d supply, fill #0
  Filled 2022-01-26: qty 30, 30d supply, fill #1
  Filled 2022-03-07: qty 30, 30d supply, fill #2

## 2021-11-09 MED ORDER — TRAZODONE HCL 50 MG PO TABS
50.0000 mg | ORAL_TABLET | Freq: Every evening | ORAL | 3 refills | Status: DC | PRN
  Filled 2021-11-09 – 2021-12-22 (×2): qty 30, 30d supply, fill #0
  Filled 2022-01-26: qty 30, 30d supply, fill #1
  Filled 2022-03-07: qty 30, 30d supply, fill #2

## 2021-11-09 MED ORDER — HYDROXYZINE HCL 10 MG PO TABS
10.0000 mg | ORAL_TABLET | Freq: Three times a day (TID) | ORAL | 3 refills | Status: DC | PRN
  Filled 2021-11-09 – 2021-12-22 (×2): qty 90, 30d supply, fill #0
  Filled 2022-01-26: qty 90, 30d supply, fill #1
  Filled 2022-03-07: qty 90, 30d supply, fill #2

## 2021-11-09 MED ORDER — QUETIAPINE FUMARATE 50 MG PO TABS
150.0000 mg | ORAL_TABLET | Freq: Every day | ORAL | 3 refills | Status: DC
  Filled 2021-11-09 – 2021-12-22 (×2): qty 90, 30d supply, fill #0
  Filled 2022-01-26: qty 90, 30d supply, fill #1
  Filled 2022-03-07: qty 90, 30d supply, fill #2

## 2021-11-09 NOTE — Progress Notes (Signed)
BH MD/PA/NP OP Progress Note Virtual Visit via Telephone Note  I connected with Chelsea Blake on 11/09/21 at 11:30 AM EDT by telephone and verified that I am speaking with the correct person using two identifiers.  Location: Patient: home Provider: Clinic   I discussed the limitations, risks, security and privacy concerns of performing an evaluation and management service by telephone and the availability of in person appointments. I also discussed with the patient that there may be a patient responsible charge related to this service. The patient expressed understanding and agreed to proceed.   I provided 20 minutes of non-face-to-face time during this encounter.   11/09/2021 11:44 AM Chelsea Blake  MRN:  [id number]  Chief Complaint: "Things are a whole lot better"  HPI: 51 year old female seen today for follow up psychiatric evaluation.    She has a psychiatric history of ADHD, alcohol dependence, cocaine dependence, stimulant dependence, nicotine dependence, bipolar disorder, and schizophrenia.  She is currently Seroquel  150 nightly, and  Buspar 30 mg twice daily, Prozac 10 mg daily, and trazodone 50 mg nightly as needed. She notes her medications are somewhat effective in managing her psychiatric conditions.   Today she was unable to login virtually so her assessment was done over the phone. During exam she was pleasant, cooperative, and engaged in conversation. She informed provider that things are a whole lot better.  She informed Clinical research associate that she only hallucinates when she is out in public.  She notes that she feels paranoid that people are talking to her and then hear their voices later.  Patient informed writer that since starting Prozac her depression has improved but notes that her anxiety is still problematic.  She informed Clinical research associate that she discontinued BuSpar because of noted side effects.  She informed Clinical research associate that it caused her legs to feel stiff and heavy.   Today provider conducted a GAD-7 and patient scored a 15, at her last visit she scored a 4.  Provider also conducted PHQ-9 and patient scored a 4, at her last visit she scored a 7.  She endorses adequate sleep and appetite.  Today she denies SI/HI/VAH or mania.    At this time patient wishes not to restart BuSpar.  She is agreeable to starting hydroxyzine 10 mg 3 times daily to help manage anxiety.  Prozac increased from 10 mg to 20 mg to help manage anxiety and depression.  She will continue her other medications as prescribed.  No other concerns at this time.    Visit Diagnosis:  No diagnosis found.   Past Psychiatric History: ADHD, alcohol dependence, cocaine dependence, stimulant dependence, nicotine dependence, bipolar disorder, and schizophrenia (patient notes that she received this diagnosis  In prison however provider unable to locate past records).   Past Medical History: No past medical history on file. No past surgical history on file.  Family Psychiatric History: Sister eating disorder, maternal uncle schizophrenia, father alcohol use  Family History:  Family History  Problem Relation Age of Onset   Arthritis Father    Throat cancer Other    Brain cancer Other    Breast cancer Other    Hypertension Other     Social History:  Social History   Socioeconomic History   Marital status: Divorced    Spouse name: Not on file   Number of children: Not on file   Years of education: Not on file   Highest education level: Not on file  Occupational History   Not on file  Tobacco Use   Smoking status: Every Day    Packs/day: 1.00    Types: Cigarettes   Smokeless tobacco: Former  Substance and Sexual Activity   Alcohol use: Yes    Alcohol/week: 0.0 standard drinks of alcohol    Comment: socially 1 beer a month   Drug use: Yes    Types: Cocaine    Comment: crack, heroin- pt did not confirm or deny types of substances used when asked    Sexual activity: Not Currently  Other  Topics Concern   Not on file  Social History Narrative   Not on file   Social Determinants of Health   Financial Resource Strain: Not on file  Food Insecurity: Not on file  Transportation Needs: Not on file  Physical Activity: Not on file  Stress: Not on file  Social Connections: Not on file    Allergies:  Allergies  Allergen Reactions   Codeine Itching    Metabolic Disorder Labs: No results found for: "HGBA1C", "MPG" No results found for: "PROLACTIN" Lab Results  Component Value Date   CHOL 195 05/16/2015   TRIG 164 (H) 05/16/2015   HDL 71 05/16/2015   CHOLHDL 2.7 05/16/2015   VLDL 33 (H) 05/16/2015   LDLCALC 91 05/16/2015   Lab Results  Component Value Date   TSH 1.180 03/23/2015    Therapeutic Level Labs: No results found for: "LITHIUM" No results found for: "VALPROATE" No results found for: "CBMZ"  Current Medications: Current Outpatient Medications  Medication Sig Dispense Refill   busPIRone (BUSPAR) 30 MG tablet Take 1 tablet (30 mg total) by mouth 2 (two) times daily. 60 tablet 3   FLUoxetine (PROZAC) 10 MG capsule Take 1 capsule (10 mg total) by mouth once daily. 30 capsule 3   QUEtiapine (SEROQUEL) 50 MG tablet Take 3 tablets (150 mg total) by mouth at bedtime. 90 tablet 3   traZODone (DESYREL) 50 MG tablet TAKE 1 TABLET (50 MG TOTAL) BY MOUTH AT BEDTIME AS NEEDED FOR SLEEP. 30 tablet 2   No current facility-administered medications for this visit.     Musculoskeletal: Strength & Muscle Tone:  Unable to assess due to telephone visit Gait & Station:  Unable to assess due to telephone visit Patient leans: N/A  Psychiatric Specialty Exam: Review of Systems  There were no vitals taken for this visit.There is no height or weight on file to calculate BMI.  General Appearance:  Unable to assess due to telephone visit  Eye Contact:   Unable to assess due to telephone visit  Speech:  Clear and Coherent and Normal Rate  Volume:  Normal  Mood:  Anxious  and Depressed  Affect:  Appropriate and Congruent  Thought Process:  Coherent, Goal Directed and Linear  Orientation:  Full (Time, Place, and Person)  Thought Content: Logical, Hallucinations: Auditory, and reports feeling paranoid and hallucinations in social settings  Suicidal Thoughts:  No  Homicidal Thoughts:  No  Memory:  Immediate;   Good Recent;   Good Remote;   Good  Judgement:  Good  Insight:  Good  Psychomotor Activity:   Unable to assess due to telephone visit  Concentration:  Concentration: Good and Attention Span: Good  Recall:  Good  Fund of Knowledge: Good  Language: Good  Akathisia:  No  Handed:  Right  AIMS (if indicated): Not done, telephone visit  Assets:  Communication Skills Desire for Improvement Financial Resources/Insurance Housing Intimacy Social Support  ADL's:  Intact  Cognition: WNL  Sleep:  Good   Screenings: GAD-7    Flowsheet Row Video Visit from 08/08/2020 in [facility] Video Visit from 05/11/2020 in [place] from 03/04/2020 in [facility] at [place] Visit from 02/10/2020 in [facility]  Total GAD-7 Score 4 12 7 20       PHQ2-9    Flowsheet Row Video Visit from 08/08/2020 in [facility] Video Visit from 05/11/2020 in [place] from 03/04/2020 in [facility] at [place] Visit from 02/10/2020 in [facility] Office Visit from 05/16/2015 in [place]  PHQ-2 Total Score 0 0 0 5 1  PHQ-9 Total Score 7 1 -- 19 --      Flowsheet Row Video Visit from 05/11/2020 in [facility] No Risk        Assessment and Plan: Patient reports that she experiences hallucinations and paranoia in social settings.  She also notes that she has been more anxious since  starting BuSpar.At this time patient wishes not to restart BuSpar.  She is agreeable to starting hydroxyzine 10 mg 3 times daily to help manage anxiety.  Prozac increased from 10 mg to 20 mg to help manage anxiety and depression.  She will continue her other medications as prescribed.   1. Generalized anxiety disorder  Increase- FLUoxetine (PROZAC) 20 MG capsule; Take 1 capsule (20 mg total) by mouth daily.  Dispense: 30 capsule; Refill: 3 Start- hydrOXYzine (ATARAX) 10 MG tablet; Take 1 tablet (10 mg total) by mouth 3 (three) times daily as needed.  Dispense: 90 tablet; Refill: 3  2. Schizoaffective disorder, bipolar type (HCC)  Increase- FLUoxetine (PROZAC) 20 MG capsule; Take 1 capsule (20 mg total) by mouth daily.  Dispense: 30 capsule; Refill: 3 Continue- QUEtiapine (SEROQUEL) 50 MG tablet; Take 3 tablets (150 mg total) by mouth at bedtime.  Dispense: 90 tablet; Refill: 3 Continue- traZODone (DESYREL) 50 MG tablet; Take 1 tablet (50 mg total) by mouth at bedtime as needed for sleep.  Dispense: 30 tablet; Refill: 3   Follow up in 3 months Follow up with therapy Salley Slaughter, NP 11/09/2021, 11:44 AM

## 2021-11-16 ENCOUNTER — Other Ambulatory Visit: Payer: Self-pay

## 2021-12-22 ENCOUNTER — Other Ambulatory Visit: Payer: Self-pay

## 2021-12-25 ENCOUNTER — Other Ambulatory Visit: Payer: Self-pay

## 2022-01-26 ENCOUNTER — Other Ambulatory Visit: Payer: Self-pay

## 2022-01-29 ENCOUNTER — Telehealth (HOSPITAL_COMMUNITY): Payer: Self-pay | Admitting: Psychiatry

## 2022-01-29 ENCOUNTER — Other Ambulatory Visit: Payer: Self-pay

## 2022-01-30 ENCOUNTER — Ambulatory Visit (HOSPITAL_COMMUNITY): Payer: No Payment, Other | Admitting: Clinical

## 2022-02-01 ENCOUNTER — Telehealth (HOSPITAL_COMMUNITY): Payer: No Payment, Other | Admitting: Psychiatry

## 2022-02-01 ENCOUNTER — Encounter (HOSPITAL_COMMUNITY): Payer: Self-pay

## 2022-02-28 ENCOUNTER — Ambulatory Visit (HOSPITAL_COMMUNITY): Payer: No Payment, Other | Admitting: Clinical

## 2022-03-07 ENCOUNTER — Other Ambulatory Visit: Payer: Self-pay

## 2022-03-16 ENCOUNTER — Telehealth (INDEPENDENT_AMBULATORY_CARE_PROVIDER_SITE_OTHER): Payer: No Payment, Other | Admitting: Psychiatry

## 2022-03-16 ENCOUNTER — Other Ambulatory Visit: Payer: Self-pay

## 2022-03-16 ENCOUNTER — Encounter (HOSPITAL_COMMUNITY): Payer: Self-pay | Admitting: Psychiatry

## 2022-03-16 DIAGNOSIS — F411 Generalized anxiety disorder: Secondary | ICD-10-CM | POA: Diagnosis not present

## 2022-03-16 DIAGNOSIS — F250 Schizoaffective disorder, bipolar type: Secondary | ICD-10-CM | POA: Diagnosis not present

## 2022-03-16 MED ORDER — QUETIAPINE FUMARATE 50 MG PO TABS
150.0000 mg | ORAL_TABLET | Freq: Every day | ORAL | 3 refills | Status: DC
  Filled 2022-03-16 – 2022-04-06 (×2): qty 90, 30d supply, fill #0

## 2022-03-16 MED ORDER — TRAZODONE HCL 50 MG PO TABS
50.0000 mg | ORAL_TABLET | Freq: Every evening | ORAL | 3 refills | Status: DC | PRN
  Filled 2022-03-16 – 2022-04-06 (×2): qty 30, 30d supply, fill #0

## 2022-03-16 MED ORDER — FLUOXETINE HCL 40 MG PO CAPS
40.0000 mg | ORAL_CAPSULE | Freq: Every day | ORAL | 3 refills | Status: DC
  Filled 2022-03-16 – 2022-04-06 (×2): qty 30, 30d supply, fill #0

## 2022-03-16 MED ORDER — GABAPENTIN 100 MG PO CAPS
100.0000 mg | ORAL_CAPSULE | Freq: Three times a day (TID) | ORAL | 3 refills | Status: DC
  Filled 2022-03-16 – 2022-04-06 (×2): qty 90, 30d supply, fill #0

## 2022-03-16 NOTE — Progress Notes (Signed)
[place] MD/PA/NP OP Progress Note Virtual Visit via Telephone Note  I connected with Chelsea Blake on 03/16/22 at  8:00 AM EST by telephone and verified that I am speaking with the correct person using two identifiers.  Location: Patient: home Provider: Clinic   I discussed the limitations, risks, security and privacy concerns of performing an evaluation and management service by telephone and the availability of in person appointments. I also discussed with the patient that there may be a patient responsible charge related to this service. The patient expressed understanding and agreed to proceed.   I provided 20 minutes of non-face-to-face time during this encounter.   03/16/2022 8:24 AM Chelsea Blake  MRN:  [id number]  Chief Complaint: "I have been anxious and dislike hydroxyzine"  HPI: 52 year old female seen today for follow up psychiatric evaluation.    She has a psychiatric history of ADHD, alcohol dependence, cocaine dependence, stimulant dependence, nicotine dependence, bipolar disorder, and schizophrenia.  She is currently Seroquel  150 nightly, Prozac 20 mg daily, hydroxyzine 10 mg three times daily as needed aand trazodone 50 mg nightly as needed. She notes that she discontinued hydroxyzine and reports that her other medications are somewhat effective in managing her psychiatric conditions.   Today she was unable to login virtually so her assessment was done over the phone.  She informed Probation officer that she does not have video access on her phone.  Provider recommended patient coming into the clinic for her next visit.  She endorsed understanding and agreed.  Patient informed Probation officer that she continues to have anxiety and notes that she dislikes hydroxyzine as it causes her leg to fell floppy and heavy.  She informed Probation officer that busbar had a similar affect.  Patient notes that she worries about her housing noting that she and her partner has to move out of her mother in  laws home, her health, and her finances.  Provider conducted a GAD-7 and patient scored a 16, at her last visit she scored a 15.  Provider also conducted PHQ-9 and patient scored a 4, at her last visit she scored a 4.  She endorses fluctuations in appetite and adequate sleep.  Today she denies SI/HI/VAH or mania.    Today Prozac increased to help manage anxiety. Patient also agreeable to starting gabapentin 100 mg three times daily to help manage anxiety. She will continue all other medications as prescribes. Potential side effects of medication and risks vs benefits of treatment vs non-treatment were explained and discussed. All questions were answered.  No other concerns at this time.    Visit Diagnosis:    ICD-10-CM   1. Generalized anxiety disorder  F41.1 FLUoxetine (PROZAC) 40 MG capsule    gabapentin (NEURONTIN) 100 MG capsule    2. Schizoaffective disorder, bipolar type (HCC)  F25.0 FLUoxetine (PROZAC) 40 MG capsule    QUEtiapine (SEROQUEL) 50 MG tablet    traZODone (DESYREL) 50 MG tablet      Past Psychiatric History: ADHD, alcohol dependence, cocaine dependence, stimulant dependence, nicotine dependence, bipolar disorder, and schizophrenia (patient notes that she received this diagnosis  In prison however provider unable to locate past records).   Past Medical History: No past medical history on file. No past surgical history on file.  Family Psychiatric History: Sister eating disorder, maternal uncle schizophrenia, father alcohol use  Family History:  Family History  Problem Relation Age of Onset   Arthritis Father    Throat cancer Other    Brain cancer Other  Breast cancer Other    Hypertension Other     Social History:  Social History   Socioeconomic History   Marital status: Divorced    Spouse name: Not on file   Number of children: Not on file   Years of education: Not on file   Highest education level: Not on file  Occupational History   Not on file  Tobacco  Use   Smoking status: Every Day    Packs/day: 1.00    Types: Cigarettes   Smokeless tobacco: Former  Substance and Sexual Activity   Alcohol use: Yes    Alcohol/week: 0.0 standard drinks of alcohol    Comment: socially 1 beer a month   Drug use: Yes    Types: Cocaine    Comment: crack, heroin- pt did not confirm or deny types of substances used when asked    Sexual activity: Not Currently  Other Topics Concern   Not on file  Social History Narrative   Not on file   Social Determinants of Health   Financial Resource Strain: Not on file  Food Insecurity: Not on file  Transportation Needs: Not on file  Physical Activity: Not on file  Stress: Not on file  Social Connections: Not on file    Allergies:  Allergies  Allergen Reactions   Codeine Itching    Metabolic Disorder Labs: No results found for: "HGBA1C", "MPG" No results found for: "PROLACTIN" Lab Results  Component Value Date   CHOL 195 05/16/2015   TRIG 164 (H) 05/16/2015   HDL 71 05/16/2015   CHOLHDL 2.7 05/16/2015   VLDL 33 (H) 05/16/2015   LDLCALC 91 05/16/2015   Lab Results  Component Value Date   TSH 1.180 03/23/2015    Therapeutic Level Labs: No results found for: "LITHIUM" No results found for: "VALPROATE" No results found for: "CBMZ"  Current Medications: Current Outpatient Medications  Medication Sig Dispense Refill   gabapentin (NEURONTIN) 100 MG capsule Take 1 capsule (100 mg total) by mouth 3 (three) times daily. 90 capsule 3   FLUoxetine (PROZAC) 40 MG capsule Take 1 capsule (40 mg total) by mouth daily. 30 capsule 3   QUEtiapine (SEROQUEL) 50 MG tablet Take 3 tablets (150 mg total) by mouth at bedtime. 90 tablet 3   traZODone (DESYREL) 50 MG tablet Take 1 tablet (50 mg total) by mouth at bedtime as needed for sleep. 30 tablet 3   No current facility-administered medications for this visit.     Musculoskeletal: Strength & Muscle Tone:  Unable to assess due to telephone visit Gait &  Station:  Unable to assess due to telephone visit Patient leans: N/A  Psychiatric Specialty Exam: Review of Systems  There were no vitals taken for this visit.There is no height or weight on file to calculate BMI.  General Appearance:  Unable to assess due to telephone visit  Eye Contact:   Unable to assess due to telephone visit  Speech:  Clear and Coherent and Normal Rate  Volume:  Normal  Mood:  Anxious  Affect:  Appropriate and Congruent  Thought Process:  Coherent, Goal Directed and Linear  Orientation:  Full (Time, Place, and Person)  Thought Content: WDL and Logical  Suicidal Thoughts:  No  Homicidal Thoughts:  No  Memory:  Immediate;   Good Recent;   Good Remote;   Good  Judgement:  Good  Insight:  Good  Psychomotor Activity:   Unable to assess due to telephone visit  Concentration:  Concentration: Good and  Attention Span: Good  Recall:  Good  Fund of Knowledge: Good  Language: Good  Akathisia:  No  Handed:  Right  AIMS (if indicated): Not done, telephone visit  Assets:  Communication Skills Desire for Improvement Financial Resources/Insurance Housing Intimacy Social Support  ADL's:  Intact  Cognition: WNL  Sleep:  Good   Screenings: GAD-7    Flowsheet Row Video Visit from 03/16/2022 in [facility] Video Visit from 11/09/2021 in [facility] Video Visit from 08/08/2020 in [facility] Video Visit from 05/11/2020 in [facility] Telemedicine from 03/04/2020 in [facility] at [facility]  Total GAD-7 Score 16 15 4 12 7       PHQ2-9    Flowsheet Row Video Visit from 03/16/2022 in [facility] Video Visit from 11/09/2021 in [facility] Video Visit from 08/08/2020 in [facility] Video Visit from 05/11/2020 in [facility] Telemedicine from 03/04/2020  in Primary Care at [facility] Total Score 0 3 0 0 0  PHQ-9 Total Score 4 4 7 1  --      [facility]  C-SSRS RISK CATEGORY No Risk        Assessment and Plan: Patient reports that her depression and mood is well managed however notes that she continues to be anxious. Today she is agreeable to increasing Prozac 20 mg to 40 mg to help manage anxiety. She is also agreeable to start Gabapentin 100 mg three times daily to help manage anxiety. At this time she does not wish to restart hydroxyzine. She will continue all other medications as prescribed.  1. Generalized anxiety disorder  -Increase FLUoxetine (PROZAC) 40 MG capsule; Take 1 capsule (40 mg total) by mouth daily.  Dispense: 30 capsule; Refill: 3 Start- gabapentin (NEURONTIN) 100 MG capsule; Take 1 capsule (100 mg total) by mouth 3 (three) times daily.  Dispense: 90 capsule; Refill: 3  2. Schizoaffective disorder, bipolar type (HCC)  Increase wrist- FLUoxetine (PROZAC) 40 MG capsule; Take 1 capsule (40 mg total) by mouth daily.  Dispense: 30 capsule; Refill: 3 Continue- QUEtiapine (SEROQUEL) 50 MG tablet; Take 3 tablets (150 mg total) by mouth at bedtime.  Dispense: 90 tablet; Refill: 3 Continue- traZODone (DESYREL) 50 MG tablet; Take 1 tablet (50 mg total) by mouth at bedtime as needed for sleep.  Dispense: 30 tablet; Refill: 3    Follow up in 3 months Follow up with therapy 05/13/2020, NP 03/16/2022, 8:24 AM

## 2022-03-23 ENCOUNTER — Other Ambulatory Visit: Payer: Self-pay

## 2022-04-06 ENCOUNTER — Other Ambulatory Visit: Payer: Self-pay

## 2022-04-09 ENCOUNTER — Other Ambulatory Visit: Payer: Self-pay

## 2022-04-27 ENCOUNTER — Ambulatory Visit (HOSPITAL_COMMUNITY)
Admission: EM | Admit: 2022-04-27 | Discharge: 2022-04-27 | Disposition: A | Discharge status: Home / Self Care | Payer: Medicaid Other | Attending: Emergency Medicine | Admitting: Emergency Medicine

## 2022-04-27 ENCOUNTER — Encounter (HOSPITAL_COMMUNITY): Payer: Self-pay

## 2022-04-27 ENCOUNTER — Ambulatory Visit: Payer: Self-pay | Admitting: *Deleted

## 2022-04-27 DIAGNOSIS — J441 Chronic obstructive pulmonary disease with (acute) exacerbation: Secondary | ICD-10-CM

## 2022-04-27 DIAGNOSIS — R21 Rash and other nonspecific skin eruption: Secondary | ICD-10-CM

## 2022-04-27 DIAGNOSIS — J019 Acute sinusitis, unspecified: Secondary | ICD-10-CM

## 2022-04-27 HISTORY — DX: Schizoaffective disorder, unspecified: F25.9

## 2022-04-27 HISTORY — DX: Post-traumatic stress disorder, unspecified: F43.10

## 2022-04-27 HISTORY — DX: Anxiety disorder, unspecified: F41.9

## 2022-04-27 MED ORDER — PREDNISONE 10 MG (21) PO TBPK: ORAL_TABLET | ORAL | 0 refills | Status: DC

## 2022-04-27 MED ORDER — AMOXICILLIN-POT CLAVULANATE 875-125 MG PO TABS: 1.0000 | ORAL_TABLET | Freq: Two times a day (BID) | ORAL | 0 refills | Status: DC

## 2022-04-27 MED ORDER — ALBUTEROL SULFATE HFA 108 (90 BASE) MCG/ACT IN AERS: 2.0000 | INHALATION_SPRAY | RESPIRATORY_TRACT | 0 refills | Status: DC | PRN

## 2022-04-27 NOTE — Telephone Encounter (Signed)
  Chief Complaint: multiple sores Symptoms: sores- itching, sore, eraser size with red area around, other symptoms- headache, off balance, cough, vision blurry Frequency: sores started 2-3 weeks ago- patient works at [employer]- possible exposure there Pertinent Negatives: Patient denies fever, weakness Disposition: '[]'$ ED /'[x]'$ Urgent Care (no appt availability in office) / '[]'$ Appointment(In office/virtual)/ '[]'$  [facility]/ '[]'$ Home Care/ '[]'$ Refused Recommended Disposition /'[]'$  Mobile Bus/ '[]'$  Follow-up with PCP Additional Notes: Call to office- [facility]- no open appointment-UC advised . Care advised to prevent spread given.

## 2022-04-27 NOTE — ED Provider Notes (Signed)
[place]    CSN: [id number] Arrival date & time: 04/27/22  1506      History   Chief Complaint Chief Complaint  Patient presents with   Rash   Nasal Congestion   Wheezing    HPI Chelsea Blake is a 52 y.o. female.   Patient presents with concerns of feeling unwell for the past few weeks. She reports nasal congestion, sinus pressure, and headaches for the past couple weeks. She reports the mucus is thick and bright yellow. She reports intermittent ear discomfort but denies known fever or sore throat. She has not tried anything for her symptoms. The patient reports a rash for the past week or so. She first noticed a few spots on her face and has since developed a few on her neck and back. She states it is kind of itchy but not particularly painful. 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  The patient also reports long history of cough and wheezing, worsening the past week or so. She states the cough is productive of white sputum and she has wheezing with occasional mild shortness of breath. She is a smoker and states she has been told in the past she has COPD but is just now getting established with a PCP now that she has insurance. She has never had an inhaler or anything for her breathing.   The history is provided by the patient.  Rash Associated symptoms: headaches, shortness of breath and wheezing   Associated symptoms: no fatigue, no fever, no myalgias, no nausea, no sore throat and not vomiting   Wheezing Associated symptoms: cough, ear pain, headaches, rash and shortness of breath   Associated symptoms: no chest pain, no fatigue, no fever and no sore throat     Past Medical History:  Diagnosis Date   Anxiety    PTSD (post-traumatic stress disorder)    Schizoaffective disorder [facility])     Patient Active Problem List   Diagnosis Date Noted   Schizoaffective disorder, bipolar type ([place])  02/10/2020   PTSD (post-traumatic stress disorder) 02/10/2020   Generalized anxiety disorder 02/10/2020   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      Home Medications    Prior to Admission medications   Medication Sig Start Date End Date Taking? Authorizing Provider  albuterol (VENTOLIN HFA) 108 (90 Base) MCG/ACT inhaler Inhale 2 puffs into the lungs every 4 (four) hours as needed for wheezing or shortness of breath. 04/27/22  Yes Somaly Marteney L, PA  amoxicillin-clavulanate (AUGMENTIN) 875-125 MG tablet Take 1 tablet by mouth every 12 (twelve) hours. 04/27/22  Yes Dailee Manalang L, PA  FLUoxetine (PROZAC) 40 MG capsule Take 1 capsule (40 mg total) by mouth daily. Patient taking differently: Take 60 mg by mouth daily. 03/16/22  Yes Eulis Canner E, NP  gabapentin (NEURONTIN) 100 MG capsule Take 1 capsule (100 mg total) by mouth 3 (three) times daily. 03/16/22  Yes Eulis Canner E, NP  predniSONE (STERAPRED UNI-PAK 21 TAB) 10 MG (21) TBPK tablet Take as directed 04/27/22  Yes Shawnte Winton L, PA  QUEtiapine (SEROQUEL) 50 MG tablet  Take 3 tablets (150 mg total) by mouth at bedtime. 03/16/22  Yes Eulis Canner E, NP  traZODone (DESYREL) 50 MG tablet Take 1 tablet (50 mg total) by mouth at bedtime as needed for sleep. 03/16/22  Yes Salley Slaughter, NP    Family History Family History  Problem Relation Age of Onset   Arthritis Father    Throat cancer Other    Brain cancer Other    Breast cancer Other    Hypertension Other     Social History Social History   Tobacco Use   Smoking status: Every Day    Packs/day: 1.00    Types: Cigarettes   Smokeless tobacco: Former  Brewing technologist Use: Former   Substances: Nicotine, Flavoring  Substance Use Topics   Alcohol use: Yes    Alcohol/week: 0.0 standard drinks of alcohol    Comment: socially 1 beer a month   Drug use: Not Currently    Types: Cocaine    Comment: crack, heroin- pt did not confirm or deny types of substances used when asked      Allergies   Codeine   Review of Systems Review of Systems  Constitutional:  Negative for fatigue and fever.  HENT:  Positive for congestion, ear pain and sinus pressure. Negative for sore throat.   Respiratory:  Positive for cough, shortness of breath and wheezing.   Cardiovascular:  Negative for chest pain.  Gastrointestinal:  Negative for nausea and vomiting.  Musculoskeletal:  Negative for myalgias.  Skin:  Positive for rash.  Neurological:  Positive for headaches. Negative for dizziness.     Physical Exam Triage Vital Signs ED Triage Vitals  Enc Vitals Group     BP 04/27/22 1548 (!) 148/92     Pulse Rate 04/27/22 1548 83     Resp 04/27/22 1548 16     Temp 04/27/22 1548 98.6 F (37 C)     Temp Source 04/27/22 1548 Oral     SpO2 04/27/22 1548 94 %     Weight 04/27/22 1548 140 lb (63.5 kg)     Height 04/27/22 1548 '5\' 2"'$  (1.575 m)     Head Circumference --      Peak Flow --      Pain Score 04/27/22 1544 7     Pain Loc --      Pain Edu? --      Excl. in [place]? --    No data found.  Updated Vital Signs BP (!) 148/92 (BP Location: Right Arm)   Pulse 83   Temp 98.6 F (37 C) (Oral)   Resp 16   Ht '5\' 2"'$  (1.575 m)   Wt 140 lb (63.5 kg)   LMP  (LMP Unknown)   SpO2 94%   BMI 25.61 kg/m   Visual Acuity Right Eye Distance:   Left Eye Distance:   Bilateral Distance:    Right Eye Near:   Left Eye Near:    Bilateral Near:     Physical Exam Vitals and nursing note reviewed.  Constitutional:      General: She is not in acute distress. HENT:     Head: Normocephalic.     Right Ear: Tympanic membrane, ear canal and external ear normal.     Left  Ear: Tympanic membrane, ear canal and external ear normal.     Nose: Congestion present. No rhinorrhea.     Mouth/Throat:     Mouth: Mucous membranes are moist.  Pharynx: Oropharynx is clear. No oropharyngeal exudate or posterior oropharyngeal erythema.  Eyes:     Conjunctiva/sclera: Conjunctivae normal.     Pupils: Pupils are equal, round, and reactive to light.  Cardiovascular:     Rate and Rhythm: Normal rate and regular rhythm.     Heart sounds: Normal heart sounds.  Pulmonary:     Effort: Pulmonary effort is normal.     Breath sounds: No stridor. Wheezing (diffuse, mild to moderate) present. No rhonchi or rales.  Musculoskeletal:     Cervical back: Normal range of motion.  Lymphadenopathy:     Cervical: No cervical adenopathy.  Skin:    Comments: Erythematous papule with central excoriation and scabbing to right forehead. Similar papules to forehead, a couple to posterior neck, and one to mid back. Nontender.   Neurological:     Mental Status: She is alert.  Psychiatric:        Mood and Affect: Mood normal.      UC Treatments / Results  Labs (all labs ordered are listed, but only abnormal results are displayed) Labs Reviewed - No data to display  EKG   Radiology No results found.  Procedures Procedures (including critical care time)  Medications Ordered in UC Medications - No data to display  Initial Impression / Assessment and Plan / UC Course  I have reviewed the triage vital signs and the nursing notes.  Pertinent labs & imaging results that were available during my care of the patient were reviewed by me and considered in my medical decision making (see chart for details).     Nasal congestion, sinus pressure, and headache x2wk consistent with sinusitis. Sx and exam consistent with COPD with acute exacerbation. Prednisone taper and albuterol inhaler provided. Discussed importance of follow-up with PCP for ongoing management and discussed ER precautions.  Will Rx Augmentin to cover both sinusitis and COPD exacerbation. Rash appears possibly bacterial in nature, especially given likely exposure with dirty clothes. Advised that the Augmentin already being prescribed should treat if bacterial cause. Discussed following up with PCP or dermatologist if no improvement.   E/M: 1 acute uncomplicated illness & 1 chronic illness with exacerbation, no data, moderate risk due to prescription management  Final Clinical Impressions(s) / UC Diagnoses   Final diagnoses:  Acute non-recurrent sinusitis, unspecified location  Rash and nonspecific skin eruption  COPD with exacerbation ([place])     Discharge Instructions      Take Augmentin (antibiotic) as prescribed to help with sinus infection and hopefully help with your rash.  Take prednisone as prescribed and use inhaler as needed to help with cough and wheezing.  Follow-up with PCP for further management of COPD and other routine health screenings.    ED Prescriptions     Medication Sig Dispense Auth. Provider   amoxicillin-clavulanate (AUGMENTIN) 875-125 MG tablet Take 1 tablet by mouth every 12 (twelve) hours. 14 tablet Abner Greenspan, Kayela Humphres L, PA   predniSONE (STERAPRED UNI-PAK 21 TAB) 10 MG (21) TBPK tablet Take as directed 1 each Abner Greenspan, Loys Shugars L, PA   albuterol (VENTOLIN HFA) 108 (90 Base) MCG/ACT inhaler Inhale 2 puffs into the lungs every 4 (four) hours as needed for wheezing or shortness of breath. 1 each Delsa Sale, PA      PDMP not reviewed this encounter.   Delsa Sale, [place] 04/27/22 [phone]

## 2022-04-27 NOTE — Discharge Instructions (Addendum)
Take Augmentin (antibiotic) as prescribed to help with sinus infection and hopefully help with your rash.  Take prednisone as prescribed and use inhaler as needed to help with cough and wheezing.  Follow-up with PCP for further management of COPD and other routine health screenings.

## 2022-04-27 NOTE — ED Triage Notes (Signed)
Chief Complaint: rash, nasal congestions, urinary frequency as well (Concerned for elevated blood sugar). Runny nose with yellow mucus and headache. No history of breathing problems or skin issues.   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 New mental health med as well.   Recent Travel: No

## 2022-04-27 NOTE — Telephone Encounter (Signed)
PCP aware

## 2022-04-27 NOTE — Telephone Encounter (Signed)
Reason for Disposition  [1] Unexplained sores AND [2] 3 or more  Answer Assessment - Initial Assessment Questions 1. APPEARANCE of SORES: "What do the sores look like?"     Round, red, blister like 2. NUMBER: "How many sores are there?"     Multiple- face, back, neck, elbow, top of head,ear 3. SIZE: "How big is the largest sore?"     Pencil eraser 4. LOCATION: "Where are the sores located?"     See above 5. ONSET: "When did the sores begin?"     Started 2-3 weeks ago 6. TENDER: "Does it hurt when you touch it?"  (Scale 1-10; or mild, moderate, severe)      Pain in head,neck and back, eyebrow 7. CAUSE: "What do you think is causing the sores?"     unsure 8. OTHER SYMPTOMS: "Do you have any other symptoms?" (e.g., fever, new weakness)     No appetite, headache, off balance, blurry vision  Protocols used: Sores-A-AH

## 2022-06-05 ENCOUNTER — Encounter (HOSPITAL_COMMUNITY): Payer: Self-pay

## 2022-06-05 ENCOUNTER — Telehealth (HOSPITAL_COMMUNITY): Payer: Medicaid Other | Admitting: Psychiatry

## 2022-06-05 ENCOUNTER — Other Ambulatory Visit (HOSPITAL_COMMUNITY): Payer: Self-pay | Admitting: Psychiatry

## 2022-06-05 DIAGNOSIS — F411 Generalized anxiety disorder: Secondary | ICD-10-CM

## 2022-06-05 DIAGNOSIS — F250 Schizoaffective disorder, bipolar type: Secondary | ICD-10-CM

## 2022-06-05 MED ORDER — QUETIAPINE FUMARATE 200 MG PO TABS: 200.0000 mg | ORAL_TABLET | Freq: Every day | ORAL | 3 refills | Status: DC

## 2022-06-05 MED ORDER — GABAPENTIN 300 MG PO CAPS: 300.0000 mg | ORAL_CAPSULE | Freq: Three times a day (TID) | ORAL | 3 refills | Status: DC

## 2022-06-05 MED ORDER — TRAZODONE HCL 50 MG PO TABS: 50.0000 mg | ORAL_TABLET | Freq: Every evening | ORAL | 3 refills | Status: DC | PRN

## 2022-06-05 MED ORDER — FLUOXETINE HCL 20 MG PO CAPS: 20.0000 mg | ORAL_CAPSULE | Freq: Every day | ORAL | 3 refills | Status: DC

## 2022-06-05 NOTE — Telephone Encounter (Signed)
Patient informed Clinical research associate that she forgot about her appointment today.  She notes that she was unable to logon virtually but informed writer that at her next visit she will come in person.  She reports that she believes her higher dose of Prozac gave her an allergic reaction and she requested for it to be lowered to 20 mg.  Provider was agreeable to this.  Patient also informed writer that she has been more anxious, depressed, and endorses symptoms of VAH and paranoia.  Today gabapentin 100 mg 3 times daily increased to 300 mg 3 times daily to help manage anxiety.  Seroquel increased from 150 mg to 200 mg to help depression and symptoms of psychosis.  Patients appointment was rescheduled for June 18 at 1:00 in person.  No other concerns noted at this time.

## 2022-08-14 ENCOUNTER — Ambulatory Visit (HOSPITAL_COMMUNITY): Payer: Medicaid Other | Admitting: Psychiatry

## 2022-08-16 ENCOUNTER — Emergency Department (HOSPITAL_COMMUNITY): Payer: MEDICAID

## 2022-08-16 ENCOUNTER — Other Ambulatory Visit: Payer: Self-pay

## 2022-08-16 ENCOUNTER — Encounter (HOSPITAL_COMMUNITY): Payer: Self-pay

## 2022-08-16 ENCOUNTER — Inpatient Hospital Stay (HOSPITAL_COMMUNITY)
Admission: EM | Admit: 2022-08-16 | Discharge: 2022-09-05 | DRG: 563 | Disposition: A | Discharge status: Home / Self Care | Payer: MEDICAID | Attending: Orthopedic Surgery | Admitting: Orthopedic Surgery

## 2022-08-16 DIAGNOSIS — Z803 Family history of malignant neoplasm of breast: Secondary | ICD-10-CM

## 2022-08-16 DIAGNOSIS — F32A Depression, unspecified: Secondary | ICD-10-CM | POA: Diagnosis present

## 2022-08-16 DIAGNOSIS — F1011 Alcohol abuse, in remission: Secondary | ICD-10-CM | POA: Diagnosis present

## 2022-08-16 DIAGNOSIS — Z79899 Other long term (current) drug therapy: Secondary | ICD-10-CM

## 2022-08-16 DIAGNOSIS — Z880 Allergy status to penicillin: Secondary | ICD-10-CM

## 2022-08-16 DIAGNOSIS — F1411 Cocaine abuse, in remission: Secondary | ICD-10-CM | POA: Diagnosis present

## 2022-08-16 DIAGNOSIS — Z5900 Homelessness unspecified: Secondary | ICD-10-CM

## 2022-08-16 DIAGNOSIS — Z808 Family history of malignant neoplasm of other organs or systems: Secondary | ICD-10-CM

## 2022-08-16 DIAGNOSIS — S82102A Unspecified fracture of upper end of left tibia, initial encounter for closed fracture: Principal | ICD-10-CM | POA: Diagnosis present

## 2022-08-16 DIAGNOSIS — S82002A Unspecified fracture of left patella, initial encounter for closed fracture: Secondary | ICD-10-CM | POA: Diagnosis present

## 2022-08-16 DIAGNOSIS — F259 Schizoaffective disorder, unspecified: Secondary | ICD-10-CM | POA: Diagnosis present

## 2022-08-16 DIAGNOSIS — E876 Hypokalemia: Secondary | ICD-10-CM | POA: Diagnosis present

## 2022-08-16 DIAGNOSIS — Z634 Disappearance and death of family member: Secondary | ICD-10-CM

## 2022-08-16 DIAGNOSIS — Z8249 Family history of ischemic heart disease and other diseases of the circulatory system: Secondary | ICD-10-CM

## 2022-08-16 DIAGNOSIS — F4310 Post-traumatic stress disorder, unspecified: Secondary | ICD-10-CM | POA: Diagnosis present

## 2022-08-16 DIAGNOSIS — S82832A Other fracture of upper and lower end of left fibula, initial encounter for closed fracture: Secondary | ICD-10-CM | POA: Diagnosis present

## 2022-08-16 DIAGNOSIS — F17210 Nicotine dependence, cigarettes, uncomplicated: Secondary | ICD-10-CM | POA: Diagnosis present

## 2022-08-16 DIAGNOSIS — S62292A Other fracture of first metacarpal bone, left hand, initial encounter for closed fracture: Secondary | ICD-10-CM | POA: Diagnosis present

## 2022-08-16 DIAGNOSIS — Z23 Encounter for immunization: Secondary | ICD-10-CM

## 2022-08-16 DIAGNOSIS — R519 Headache, unspecified: Secondary | ICD-10-CM | POA: Diagnosis present

## 2022-08-16 DIAGNOSIS — Z885 Allergy status to narcotic agent status: Secondary | ICD-10-CM

## 2022-08-16 DIAGNOSIS — S82831A Other fracture of upper and lower end of right fibula, initial encounter for closed fracture: Secondary | ICD-10-CM | POA: Diagnosis present

## 2022-08-16 DIAGNOSIS — Z8261 Family history of arthritis: Secondary | ICD-10-CM

## 2022-08-16 DIAGNOSIS — Y92410 Unspecified street and highway as the place of occurrence of the external cause: Secondary | ICD-10-CM

## 2022-08-16 DIAGNOSIS — Y9301 Activity, walking, marching and hiking: Secondary | ICD-10-CM | POA: Diagnosis present

## 2022-08-16 DIAGNOSIS — S82202A Unspecified fracture of shaft of left tibia, initial encounter for closed fracture: Secondary | ICD-10-CM | POA: Diagnosis present

## 2022-08-16 DIAGNOSIS — H9312 Tinnitus, left ear: Secondary | ICD-10-CM | POA: Diagnosis present

## 2022-08-16 DIAGNOSIS — F419 Anxiety disorder, unspecified: Secondary | ICD-10-CM | POA: Diagnosis present

## 2022-08-16 LAB — COMPREHENSIVE METABOLIC PANEL
ALT: 30 U/L (ref 0–44)
AST: 35 U/L (ref 15–41)
Albumin: 3.7 g/dL (ref 3.5–5.0)
Alkaline Phosphatase: 52 U/L (ref 38–126)
Anion gap: 17 — ABNORMAL HIGH (ref 5–15)
BUN: 11 mg/dL (ref 6–20)
CO2: 24 mmol/L (ref 22–32)
Calcium: 9.3 mg/dL (ref 8.9–10.3)
Chloride: 97 mmol/L — ABNORMAL LOW (ref 98–111)
Creatinine, Ser: 0.79 mg/dL (ref 0.44–1.00)
GFR, Estimated: >60 mL/min (ref >60)
Glucose, Bld: 94 mg/dL (ref 70–99)
Potassium: 2.8 mmol/L — ABNORMAL LOW (ref 3.5–5.1)
Sodium: 138 mmol/L (ref 135–145)
Total Bilirubin: 1.1 mg/dL (ref 0.3–1.2)
Total Protein: 6.7 g/dL (ref 6.5–8.1)

## 2022-08-16 LAB — I-STAT CHEM 8, ED
BUN: 12 mg/dL (ref 6–20)
Calcium, Ion: 1.12 mmol/L — ABNORMAL LOW (ref 1.15–1.40)
Chloride: 99 mmol/L (ref 98–111)
Creatinine, Ser: 0.7 mg/dL (ref 0.44–1.00)
Glucose, Bld: 91 mg/dL (ref 70–99)
HCT: 41 % (ref 36.0–46.0)
Hemoglobin: 13.9 g/dL (ref 12.0–15.0)
Potassium: 2.8 mmol/L — ABNORMAL LOW (ref 3.5–5.1)
Sodium: 138 mmol/L (ref 135–145)
TCO2: 27 mmol/L (ref 22–32)

## 2022-08-16 LAB — ETHANOL: Alcohol, Ethyl (B): <10 mg/dL (ref <10)

## 2022-08-16 LAB — CBC
HCT: 39.2 % (ref 36.0–46.0)
Hemoglobin: 12.7 g/dL (ref 12.0–15.0)
MCH: 30.2 pg (ref 26.0–34.0)
MCHC: 32.4 g/dL (ref 30.0–36.0)
MCV: 93.3 fL (ref 80.0–100.0)
Platelets: 259 10*3/uL (ref 150–400)
RBC: 4.2 MIL/uL (ref 3.87–5.11)
RDW: 13.3 % (ref 11.5–15.5)
WBC: 8.5 10*3/uL (ref 4.0–10.5)
nRBC: 0 % (ref 0.0–0.2)

## 2022-08-16 LAB — LACTIC ACID, PLASMA: Lactic Acid, Venous: 1 mmol/L (ref 0.5–1.9)

## 2022-08-16 MED ORDER — ONDANSETRON HCL 4 MG/2ML IJ SOLN
4.0000 mg | Freq: Once | INTRAMUSCULAR | Status: AC
  Administered 2022-08-16: 4 mg via INTRAVENOUS
  Filled 2022-08-16: qty 2

## 2022-08-16 MED ORDER — SODIUM CHLORIDE 0.9 % IV SOLN: INTRAVENOUS | Status: DC

## 2022-08-16 MED ORDER — IOHEXOL 350 MG/ML SOLN
75.0000 mL | Freq: Once | INTRAVENOUS | Status: AC | PRN
  Administered 2022-08-16: 75 mL via INTRAVENOUS

## 2022-08-16 MED ORDER — SODIUM CHLORIDE 0.9 % IV BOLUS
1000.0000 mL | Freq: Once | INTRAVENOUS | Status: AC
  Administered 2022-08-16: 1000 mL via INTRAVENOUS

## 2022-08-16 MED ORDER — FENTANYL CITRATE PF 50 MCG/ML IJ SOSY
50.0000 ug | PREFILLED_SYRINGE | Freq: Once | INTRAMUSCULAR | Status: DC
  Filled 2022-08-16: qty 1

## 2022-08-16 MED ORDER — TETANUS-DIPHTH-ACELL PERTUSSIS 5-2.5-18.5 LF-MCG/0.5 IM SUSY
0.5000 mL | PREFILLED_SYRINGE | Freq: Once | INTRAMUSCULAR | Status: AC
  Administered 2022-08-16: 0.5 mL via INTRAMUSCULAR
  Filled 2022-08-16: qty 0.5
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
## 2022-08-17 ENCOUNTER — Observation Stay (HOSPITAL_COMMUNITY): Payer: MEDICAID | Admitting: Anesthesiology

## 2022-08-17 ENCOUNTER — Observation Stay (HOSPITAL_COMMUNITY): Payer: MEDICAID

## 2022-08-17 ENCOUNTER — Emergency Department (HOSPITAL_COMMUNITY): Payer: MEDICAID

## 2022-08-17 ENCOUNTER — Encounter (HOSPITAL_COMMUNITY): Payer: Self-pay | Admitting: Orthopedic Surgery

## 2022-08-17 DIAGNOSIS — S82202A Unspecified fracture of shaft of left tibia, initial encounter for closed fracture: Secondary | ICD-10-CM | POA: Diagnosis present

## 2022-08-17 LAB — CREATININE, SERUM
Creatinine, Ser: 0.69 mg/dL (ref 0.44–1.00)
GFR, Estimated: >60 mL/min (ref >60)

## 2022-08-17 LAB — URINALYSIS, ROUTINE W REFLEX MICROSCOPIC
Glucose, UA: NEGATIVE mg/dL
Hgb urine dipstick: NEGATIVE
Ketones, ur: 20 mg/dL — AB
Leukocytes,Ua: NEGATIVE
Nitrite: NEGATIVE
Protein, ur: 30 mg/dL — AB
Specific Gravity, Urine: >1.046 — ABNORMAL HIGH (ref 1.005–1.030)
pH: 5 (ref 5.0–8.0)

## 2022-08-17 LAB — CBC
HCT: 37.6 % (ref 36.0–46.0)
Hemoglobin: 11.9 g/dL — ABNORMAL LOW (ref 12.0–15.0)
MCH: 29.1 pg (ref 26.0–34.0)
MCHC: 31.6 g/dL (ref 30.0–36.0)
MCV: 91.9 fL (ref 80.0–100.0)
Platelets: 241 10*3/uL (ref 150–400)
RBC: 4.09 MIL/uL (ref 3.87–5.11)
RDW: 13.3 % (ref 11.5–15.5)
WBC: 9.2 10*3/uL (ref 4.0–10.5)
nRBC: 0 % (ref 0.0–0.2)

## 2022-08-17 LAB — MAGNESIUM: Magnesium: 2 mg/dL (ref 1.7–2.4)

## 2022-08-17 MED ORDER — MORPHINE SULFATE (PF) 4 MG/ML IV SOLN: 4.0000 mg | Freq: Once | INTRAVENOUS | Status: DC

## 2022-08-17 MED ORDER — QUETIAPINE FUMARATE 100 MG PO TABS
200.0000 mg | ORAL_TABLET | Freq: Every day | ORAL | Status: DC
  Filled 2022-08-17: qty 2

## 2022-08-17 MED ORDER — FLUOXETINE HCL 20 MG PO CAPS
20.0000 mg | ORAL_CAPSULE | Freq: Every day | ORAL | Status: DC
  Filled 2022-08-17 (×2): qty 1

## 2022-08-17 MED ORDER — ONDANSETRON HCL 4 MG PO TABS
4.0000 mg | ORAL_TABLET | Freq: Four times a day (QID) | ORAL | Status: DC | PRN
  Administered 2022-08-31: 4 mg via ORAL
  Filled 2022-08-17 (×2): qty 1

## 2022-08-17 MED ORDER — DIPHENHYDRAMINE HCL 12.5 MG/5ML PO ELIX
12.5000 mg | ORAL_SOLUTION | ORAL | Status: DC | PRN
  Administered 2022-08-21 – 2022-09-01 (×12): 25 mg via ORAL
  Filled 2022-08-17 (×12): qty 10

## 2022-08-17 MED ORDER — ONDANSETRON HCL 4 MG/2ML IJ SOLN: 4.0000 mg | Freq: Four times a day (QID) | INTRAMUSCULAR | Status: DC | PRN

## 2022-08-17 MED ORDER — ALBUTEROL SULFATE (2.5 MG/3ML) 0.083% IN NEBU: 2.5000 mg | INHALATION_SOLUTION | RESPIRATORY_TRACT | Status: DC | PRN

## 2022-08-17 MED ORDER — METHOCARBAMOL 500 MG PO TABS
500.0000 mg | ORAL_TABLET | Freq: Four times a day (QID) | ORAL | Status: DC | PRN
  Administered 2022-08-17 – 2022-09-05 (×47): 500 mg via ORAL
  Filled 2022-08-17 (×52): qty 1

## 2022-08-17 MED ORDER — METHOCARBAMOL 1000 MG/10ML IJ SOLN: 500.0000 mg | Freq: Four times a day (QID) | INTRAVENOUS | Status: DC | PRN

## 2022-08-17 MED ORDER — TRAZODONE HCL 50 MG PO TABS
50.0000 mg | ORAL_TABLET | Freq: Every evening | ORAL | Status: DC | PRN
  Administered 2022-08-25: 50 mg via ORAL
  Filled 2022-08-17 (×4): qty 1

## 2022-08-17 MED ORDER — KETOROLAC TROMETHAMINE 15 MG/ML IJ SOLN
15.0000 mg | Freq: Four times a day (QID) | INTRAMUSCULAR | Status: AC
  Administered 2022-08-17 – 2022-08-18 (×4): 15 mg via INTRAVENOUS
  Filled 2022-08-17 (×4): qty 1

## 2022-08-17 MED ORDER — NICOTINE 7 MG/24HR TD PT24
7.0000 mg | MEDICATED_PATCH | Freq: Every day | TRANSDERMAL | Status: DC
  Administered 2022-08-17 – 2022-09-05 (×20): 7 mg via TRANSDERMAL
  Filled 2022-08-17 (×20): qty 1

## 2022-08-17 MED ORDER — POLYETHYLENE GLYCOL 3350 17 G PO PACK
17.0000 g | PACK | Freq: Every day | ORAL | Status: DC | PRN
  Administered 2022-08-21: 17 g via ORAL
  Filled 2022-08-17: qty 1

## 2022-08-17 MED ORDER — ENOXAPARIN SODIUM 40 MG/0.4ML IJ SOSY
40.0000 mg | PREFILLED_SYRINGE | INTRAMUSCULAR | Status: DC
  Administered 2022-08-17 – 2022-09-04 (×19): 40 mg via SUBCUTANEOUS
  Filled 2022-08-17 (×19): qty 0.4

## 2022-08-17 MED ORDER — OXYCODONE HCL 5 MG PO TABS
5.0000 mg | ORAL_TABLET | ORAL | Status: DC | PRN
  Administered 2022-08-18 – 2022-09-05 (×75): 10 mg via ORAL
  Filled 2022-08-17 (×80): qty 2

## 2022-08-17 MED ORDER — ACETAMINOPHEN 500 MG PO TABS
1000.0000 mg | ORAL_TABLET | Freq: Once | ORAL | Status: AC
  Administered 2022-08-17: 1000 mg via ORAL
  Filled 2022-08-17: qty 2

## 2022-08-17 MED ORDER — ACETAMINOPHEN 325 MG PO TABS
325.0000 mg | ORAL_TABLET | Freq: Four times a day (QID) | ORAL | Status: DC | PRN
  Administered 2022-08-19 – 2022-08-24 (×12): 650 mg via ORAL
  Administered 2022-08-25: 325 mg via ORAL
  Administered 2022-08-26 – 2022-09-03 (×12): 650 mg via ORAL
  Filled 2022-08-17 (×17): qty 2
  Filled 2022-08-17: qty 1
  Filled 2022-08-17 (×7): qty 2

## 2022-08-17 MED ORDER — HYDROMORPHONE HCL 1 MG/ML IJ SOLN
0.5000 mg | INTRAMUSCULAR | Status: DC | PRN
  Administered 2022-08-17 – 2022-08-18 (×5): 1 mg via INTRAVENOUS
  Administered 2022-08-18: 0.5 mg via INTRAVENOUS
  Administered 2022-08-18 – 2022-08-20 (×5): 1 mg via INTRAVENOUS
  Filled 2022-08-17 (×12): qty 1
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
## 2022-08-17 NOTE — ED Notes (Signed)
Patient transported to CT again. 
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
## 2022-08-17 NOTE — ED Notes (Signed)
Patient transported to X-ray 
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
## 2022-08-17 NOTE — Discharge Instructions (Addendum)
Related to your left thumb, keep cast on, clean and dry.     Orthopedic Discharge Instructions  Diet: As you were doing prior to hospitalization   Shower:  May shower but keep the wounds dry, use an occlusive plastic wrap, NO SOAKING IN TUB.  If the bandage gets wet, change with a clean dry gauze.   Activity:  Increase activity slowly as tolerated, but follow the weight bearing instructions below.  The rules on driving is that you can not be taking narcotics while you drive, and you must feel in control of the vehicle.    Weight Bearing:   No weight bearing on the left leg and wear a knee immobilizer brace at all times except for hygiene/showering.  You may bear weight on the right leg as tolerated.  To prevent constipation: you may use a stool softener such as -  Colace (over the counter) 100 mg by mouth twice a day  Drink plenty of fluids (prune juice may be helpful) and high fiber foods Miralax (over the counter) for constipation as needed.    Itching:  If you experience itching with your medications, try taking only a single pain pill, or even half a pain pill at a time.  You may take up to 10 pain pills per day, and you can also use benadryl over the counter for itching or also to help with sleep.   Precautions:  If you experience chest pain or shortness of breath - call 911 immediately for transfer to the hospital emergency department!!  Low Potassium:  Your potassium level was noted to be low, therefore a short prescription of potassium was provided for you while you were in the hospital.  It is important to have your Potassium level re-checked in 1 week by your primary care provider or by your behavioral health professional.  This may be done at their office, an urgent care, or the emergency department.  Call office [phone]) for the following: Temperature greater than 101F Persistent nausea and vomiting Severe uncontrolled pain Redness, tenderness, or signs of infection  (pain, swelling, redness, odor or green/yellow discharge around the site) Difficulty breathing, headache or visual disturbances Hives Persistent dizziness or light-headedness Extreme fatigue Any other questions or concerns you may have after discharge  In an emergency, call 911 or go to an Emergency Department at a nearby hospital  Follow- Up Appointment:  Please call for an appointment to be seen in 1 week in [facility] with Dr. Weber Cooks - [phone] Address: [address], [place], [place] [address]   Psychiatry Discharge Instructions Call the [facility] at [phone]  to see if you can get a followup appt with Gretchen Short; failing that ask about current walk-in hours and present to [address] [place], [place] [address] during walk-in hours (walk in is on the second floor).  For suicidal thoughts or psychiatric emergencies, you can call 911, [phone], or go to the nearest ED or to the [facility] for an urgent assessment 24/7 (urgent assessments are on the first floor).
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
## 2022-08-17 NOTE — ED Notes (Signed)
Chelsea Blake on 5n is ready

## 2022-08-17 NOTE — ED Notes (Signed)
I called  5n to ask if the pt could come up  I was told that she just received a new pt and to call back

## 2022-08-17 NOTE — ED Notes (Signed)
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 ( Intravenous New Bag/Given 08/17/22 0009)  morphine (PF) 4 MG/ML injection 4 mg (4 mg Intravenous Patient Refused/Not Given 08/17/22 0338)  FLUoxetine (PROZAC) capsule 20 mg (20 mg Oral Not Given 08/17/22 1218)  QUEtiapine (SEROQUEL) tablet 200 mg (has no administration in time range)  traZODone (DESYREL) tablet 50 mg (has no administration in time range)  albuterol (PROVENTIL) (2.5 MG/3ML) 0.083% nebulizer solution 2.5 mg (has no administration in time range)  enoxaparin (LOVENOX) injection 40 mg (has no administration in time range)  acetaminophen (TYLENOL) tablet 325-650 mg (has no administration in time range)  HYDROmorphone (DILAUDID) injection 0.5-1 mg (has no administration in time range)  oxyCODONE (Oxy IR/ROXICODONE) immediate release tablet 5-10 mg (has no administration in time range)  ketorolac (TORADOL) 15 MG/ML injection 15 mg (15 mg Intravenous Given 08/17/22 1219)  methocarbamol (ROBAXIN) tablet 500 mg  (500 mg Oral Given 08/17/22 1219)    Or  methocarbamol (ROBAXIN) 500 mg in dextrose 5 % 50 mL IVPB ( Intravenous See Alternative 08/17/22 1219)  diphenhydrAMINE (BENADRYL) 12.5 MG/5ML elixir 12.5-25 mg (has no administration in time range)  polyethylene glycol (MIRALAX / GLYCOLAX) packet 17 g (has no administration in time range)  ondansetron (ZOFRAN) tablet 4 mg (has no administration in time range)  Or  ondansetron [facility]) injection 4 mg (has no administration in time range)  ondansetron (ZOFRAN) injection 4 mg (4 mg Intravenous Given 08/16/22 2253)  Tdap (BOOSTRIX) injection 0.5 mL (0.5 mLs Intramuscular Given 08/16/22 2253)  iohexol (OMNIPAQUE) 350 MG/ML injection 75 mL (75 mLs Intravenous Contrast Given 08/16/22 2315)  acetaminophen (TYLENOL) tablet 1,000 mg (1,000 mg Oral Given 08/17/22 0120)    Mobility walks with person assist     Focused Assessments    R Recommendations: See Admitting Provider Note  Report given to:   Additional Notes:
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
## 2022-08-18 ENCOUNTER — Encounter (HOSPITAL_COMMUNITY)
Admission: EM | Disposition: A | Discharge status: Home / Self Care | Payer: Self-pay | Source: Home / Self Care | Attending: Orthopedic Surgery

## 2022-08-18 DIAGNOSIS — Z8249 Family history of ischemic heart disease and other diseases of the circulatory system: Secondary | ICD-10-CM | POA: Diagnosis not present

## 2022-08-18 DIAGNOSIS — Z885 Allergy status to narcotic agent status: Secondary | ICD-10-CM | POA: Diagnosis not present

## 2022-08-18 DIAGNOSIS — Z803 Family history of malignant neoplasm of breast: Secondary | ICD-10-CM | POA: Diagnosis not present

## 2022-08-18 DIAGNOSIS — R519 Headache, unspecified: Secondary | ICD-10-CM | POA: Diagnosis present

## 2022-08-18 DIAGNOSIS — F17210 Nicotine dependence, cigarettes, uncomplicated: Secondary | ICD-10-CM | POA: Diagnosis present

## 2022-08-18 DIAGNOSIS — F250 Schizoaffective disorder, bipolar type: Secondary | ICD-10-CM

## 2022-08-18 DIAGNOSIS — F1420 Cocaine dependence, uncomplicated: Secondary | ICD-10-CM

## 2022-08-18 DIAGNOSIS — Z808 Family history of malignant neoplasm of other organs or systems: Secondary | ICD-10-CM | POA: Diagnosis not present

## 2022-08-18 DIAGNOSIS — S82202A Unspecified fracture of shaft of left tibia, initial encounter for closed fracture: Secondary | ICD-10-CM | POA: Diagnosis present

## 2022-08-18 DIAGNOSIS — S82832A Other fracture of upper and lower end of left fibula, initial encounter for closed fracture: Secondary | ICD-10-CM | POA: Diagnosis present

## 2022-08-18 DIAGNOSIS — F259 Schizoaffective disorder, unspecified: Secondary | ICD-10-CM | POA: Diagnosis present

## 2022-08-18 DIAGNOSIS — F1411 Cocaine abuse, in remission: Secondary | ICD-10-CM | POA: Diagnosis present

## 2022-08-18 DIAGNOSIS — S82102A Unspecified fracture of upper end of left tibia, initial encounter for closed fracture: Secondary | ICD-10-CM | POA: Diagnosis present

## 2022-08-18 DIAGNOSIS — Z23 Encounter for immunization: Secondary | ICD-10-CM | POA: Diagnosis not present

## 2022-08-18 DIAGNOSIS — F4310 Post-traumatic stress disorder, unspecified: Secondary | ICD-10-CM | POA: Diagnosis present

## 2022-08-18 DIAGNOSIS — S62292A Other fracture of first metacarpal bone, left hand, initial encounter for closed fracture: Secondary | ICD-10-CM | POA: Diagnosis present

## 2022-08-18 DIAGNOSIS — Z880 Allergy status to penicillin: Secondary | ICD-10-CM | POA: Diagnosis not present

## 2022-08-18 DIAGNOSIS — Z634 Disappearance and death of family member: Secondary | ICD-10-CM | POA: Diagnosis not present

## 2022-08-18 DIAGNOSIS — F32A Depression, unspecified: Secondary | ICD-10-CM | POA: Diagnosis present

## 2022-08-18 DIAGNOSIS — Z5900 Homelessness unspecified: Secondary | ICD-10-CM | POA: Diagnosis not present

## 2022-08-18 DIAGNOSIS — F419 Anxiety disorder, unspecified: Secondary | ICD-10-CM | POA: Diagnosis present

## 2022-08-18 DIAGNOSIS — E876 Hypokalemia: Secondary | ICD-10-CM | POA: Diagnosis present

## 2022-08-18 DIAGNOSIS — Y9301 Activity, walking, marching and hiking: Secondary | ICD-10-CM | POA: Diagnosis present

## 2022-08-18 DIAGNOSIS — S82831A Other fracture of upper and lower end of right fibula, initial encounter for closed fracture: Secondary | ICD-10-CM | POA: Diagnosis present

## 2022-08-18 DIAGNOSIS — Y92410 Unspecified street and highway as the place of occurrence of the external cause: Secondary | ICD-10-CM | POA: Diagnosis not present

## 2022-08-18 DIAGNOSIS — S82002A Unspecified fracture of left patella, initial encounter for closed fracture: Secondary | ICD-10-CM | POA: Diagnosis present

## 2022-08-18 DIAGNOSIS — Z8261 Family history of arthritis: Secondary | ICD-10-CM | POA: Diagnosis not present

## 2022-08-18 DIAGNOSIS — H9312 Tinnitus, left ear: Secondary | ICD-10-CM | POA: Diagnosis present

## 2022-08-18 DIAGNOSIS — F1011 Alcohol abuse, in remission: Secondary | ICD-10-CM | POA: Diagnosis present

## 2022-08-18 LAB — SURGICAL PCR SCREEN
MRSA, PCR: POSITIVE — AB
Staphylococcus aureus: POSITIVE — AB

## 2022-08-18 LAB — LIPID PANEL
Cholesterol: 133 mg/dL (ref 0–200)
HDL: 39 mg/dL — ABNORMAL LOW (ref >40)
LDL Cholesterol: 84 mg/dL (ref 0–99)
Total CHOL/HDL Ratio: 3.4 RATIO
Triglycerides: 51 mg/dL (ref <150)
VLDL: 10 mg/dL (ref 0–40)

## 2022-08-18 LAB — HEMOGLOBIN A1C
Hgb A1c MFr Bld: 5.6 % (ref 4.8–5.6)
Mean Plasma Glucose: 114.02 mg/dL

## 2022-08-18 SURGERY — PINNING, EXTREMITY, PERCUTANEOUS
Anesthesia: Choice | Laterality: Left

## 2022-08-18 MED ORDER — FLUOXETINE HCL 20 MG PO CAPS
20.0000 mg | ORAL_CAPSULE | Freq: Every day | ORAL | Status: DC
  Filled 2022-08-18 (×2): qty 1

## 2022-08-18 MED ORDER — CHLORHEXIDINE GLUCONATE CLOTH 2 % EX PADS
6.0000 | MEDICATED_PAD | Freq: Every day | CUTANEOUS | Status: AC
  Administered 2022-08-18 – 2022-08-22 (×4): 6 via TOPICAL

## 2022-08-18 MED ORDER — MUPIROCIN 2 % EX OINT
1.0000 | TOPICAL_OINTMENT | Freq: Two times a day (BID) | CUTANEOUS | Status: AC
  Administered 2022-08-18 – 2022-08-22 (×9): 1 via NASAL
  Filled 2022-08-18 (×2): qty 22

## 2022-08-18 MED ORDER — QUETIAPINE FUMARATE 25 MG PO TABS
50.0000 mg | ORAL_TABLET | Freq: Every day | ORAL | Status: DC
  Administered 2022-08-19: 50 mg via ORAL
  Filled 2022-08-18 (×2): qty 2
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
## 2022-08-18 NOTE — Progress Notes (Signed)
Patient refused taking seroquel stating she will not be taking any of her mental health medicines until she has to take her pain medicines.
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
## 2022-08-18 NOTE — Progress Notes (Signed)
Orthopedic Tech Progress Note Patient Details:  Latrena Benegas [facility] [birth year] [id number]  Plaster thumb spica splint applied to LUE. Sensation of digits remain intact.  Ortho Devices Type of Ortho Device: Thumb spica splint Splint Material: Plaster Ortho Device/Splint Location: LUE Ortho Device/Splint Interventions: Ordered, Adjustment, Application   Post Interventions Patient Tolerated: Well Instructions Provided: Care of device, Adjustment of device  Adeena Bernabe Carmine Savoy 08/18/2022, 8:55 AM

## 2022-08-18 NOTE — Progress Notes (Signed)
Patient refused taking her seroquel 2200 6/21 dose stating she hasn't been taking it for 4 months and want to talk to her psychiatrist before starting it. She also complained of fullness, ringing and not able to hear properly with her left ear. Angelena Form notified and got an order for monitoring. This morning after she got positive for MRSA nasal swab, she refused for surgery stating she is afraid of getting MRSA spreading in other part of her body after the surgery. Patient educated about nasal MRSA and its treatment, still refusing the surgery. Additionally patient requested to talk to social worker to talk to about her insurance.
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
## 2022-08-18 NOTE — Anesthesia Preprocedure Evaluation (Signed)
Anesthesia Evaluation  Patient identified by MRN, date of birth, ID band Patient awake    Reviewed: Allergy & Precautions, H&P , NPO status , Patient's Chart, lab work & pertinent test results  Airway Mallampati: II   Neck ROM: full    Dental   Pulmonary Current Smoker   breath sounds clear to auscultation       Cardiovascular negative cardio ROS  Rhythm:regular Rate:Normal     Neuro/Psych  PSYCHIATRIC DISORDERS Anxiety  Bipolar Disorder      GI/Hepatic   Endo/Other    Renal/GU      Musculoskeletal   Abdominal   Peds  Hematology   Anesthesia Other Findings   Reproductive/Obstetrics                             Anesthesia Physical Anesthesia Plan  ASA: 2  Anesthesia Plan: General   Post-op Pain Management:    Induction: Intravenous  PONV Risk Score and Plan: 2 and Ondansetron, Dexamethasone, Midazolam and Treatment may vary due to age or medical condition  Airway Management Planned: LMA  Additional Equipment:   Intra-op Plan:   Post-operative Plan: Extubation in OR  Informed Consent: I have reviewed the patients History and Physical, chart, labs and discussed the procedure including the risks, benefits and alternatives for the proposed anesthesia with the patient or authorized representative who has indicated his/her understanding and acceptance.     Dental advisory given  Plan Discussed with: CRNA, Anesthesiologist and Surgeon  Anesthesia Plan Comments:        Anesthesia Quick Evaluation

## 2022-08-18 NOTE — Plan of Care (Signed)
  Problem: Activity: Goal: Risk for activity intolerance will decrease Outcome: Progressing   Problem: Nutrition: Goal: Adequate nutrition will be maintained Outcome: Progressing   Problem: Coping: Goal: Level of anxiety will decrease Outcome: Progressing   
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
## 2022-08-19 NOTE — Plan of Care (Signed)

## 2022-08-19 NOTE — Plan of Care (Signed)
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
## 2022-08-20 ENCOUNTER — Inpatient Hospital Stay (HOSPITAL_COMMUNITY): Payer: MEDICAID

## 2022-08-20 DIAGNOSIS — F4310 Post-traumatic stress disorder, unspecified: Secondary | ICD-10-CM | POA: Diagnosis not present

## 2022-08-20 LAB — BASIC METABOLIC PANEL
Anion gap: 8 (ref 5–15)
BUN: 5 mg/dL — ABNORMAL LOW (ref 6–20)
CO2: 26 mmol/L (ref 22–32)
Calcium: 8.1 mg/dL — ABNORMAL LOW (ref 8.9–10.3)
Chloride: 102 mmol/L (ref 98–111)
Creatinine, Ser: 0.68 mg/dL (ref 0.44–1.00)
GFR, Estimated: >60 mL/min (ref >60)
Glucose, Bld: 159 mg/dL — ABNORMAL HIGH (ref 70–99)
Potassium: 3 mmol/L — ABNORMAL LOW (ref 3.5–5.1)
Sodium: 136 mmol/L (ref 135–145)

## 2022-08-20 MED ORDER — BACITRACIN ZINC 500 UNIT/GM EX OINT
TOPICAL_OINTMENT | Freq: Every day | CUTANEOUS | Status: AC
  Administered 2022-08-24 – 2022-08-29 (×6): 31.5 via TOPICAL
  Filled 2022-08-20: qty 28.4

## 2022-08-20 MED ORDER — QUETIAPINE FUMARATE 25 MG PO TABS
25.0000 mg | ORAL_TABLET | Freq: Every day | ORAL | Status: DC | PRN
  Administered 2022-08-21: 25 mg via ORAL
  Filled 2022-08-20 (×2): qty 1

## 2022-08-20 MED ORDER — HYDROMORPHONE HCL 1 MG/ML IJ SOLN
0.5000 mg | Freq: Two times a day (BID) | INTRAMUSCULAR | Status: DC | PRN
  Administered 2022-08-20 – 2022-08-25 (×10): 0.5 mg via INTRAVENOUS
  Filled 2022-08-20 (×11): qty 0.5

## 2022-08-20 MED ORDER — POTASSIUM CHLORIDE CRYS ER 20 MEQ PO TBCR
20.0000 meq | EXTENDED_RELEASE_TABLET | Freq: Two times a day (BID) | ORAL | Status: AC
  Administered 2022-08-20 – 2022-08-21 (×4): 20 meq via ORAL
  Filled 2022-08-20 (×4): qty 1
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
## 2022-08-20 NOTE — Progress Notes (Signed)
Patient stating that she is "Refusal of all mental health meds until she is released from the hospital".

## 2022-08-20 NOTE — Progress Notes (Signed)
PT Cancellation Note  Patient Details Name: Chelsea Blake MRN: [id number] DOB: 07-03-1970   Cancelled Treatment:    Reason Eval/Treat Not Completed: (P) Other (comment) (Pt refused PT this session, She wants a cigarette and states," I can't do anything until this seroquel gets out of my system."  Educated on benefits & how she should be okay to participate with PTA and RN providing encouragement she continues to decline.)   Lino Wickliff J Aundria Rud 08/20/2022, 3:58 PM  Bonney Leitz , PTA Acute Rehabilitation Services Office [phone]
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
## 2022-08-21 ENCOUNTER — Other Ambulatory Visit: Payer: Self-pay

## 2022-08-21 MED ORDER — OXYCODONE HCL 5 MG PO TABS
5.0000 mg | ORAL_TABLET | ORAL | 0 refills | Status: DC | PRN
  Filled 2022-08-21: qty 28, 5d supply, fill #0

## 2022-08-21 MED ORDER — ASPIRIN 81 MG PO TBEC: 81.0000 mg | DELAYED_RELEASE_TABLET | Freq: Two times a day (BID) | ORAL | 0 refills | Status: DC

## 2022-08-21 MED ORDER — ONDANSETRON HCL 4 MG PO TABS
4.0000 mg | ORAL_TABLET | Freq: Three times a day (TID) | ORAL | 0 refills | Status: DC | PRN
  Filled 2022-08-21: qty 14, 5d supply, fill #0

## 2022-08-21 MED ORDER — POTASSIUM CHLORIDE CRYS ER 20 MEQ PO TBCR
20.0000 meq | EXTENDED_RELEASE_TABLET | Freq: Two times a day (BID) | ORAL | 0 refills | Status: DC
  Filled 2022-08-21: qty 8, 4d supply, fill #0

## 2022-08-21 MED ORDER — NAPROXEN 500 MG PO TABS
500.0000 mg | ORAL_TABLET | Freq: Two times a day (BID) | ORAL | 0 refills | Status: DC
  Filled 2022-08-21: qty 60, 30d supply, fill #0

## 2022-08-21 MED ORDER — ACETAMINOPHEN 500 MG PO TABS: 1000.0000 mg | ORAL_TABLET | Freq: Three times a day (TID) | ORAL | 0 refills | Status: DC | PRN

## 2022-08-21 MED ORDER — METHOCARBAMOL 500 MG PO TABS
500.0000 mg | ORAL_TABLET | Freq: Three times a day (TID) | ORAL | 0 refills | Status: DC | PRN
  Filled 2022-08-21: qty 30, 10d supply, fill #0
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
## 2022-08-21 NOTE — Progress Notes (Signed)
Patient refused her dsg change this morning. Said she want it done after she takes her shower during day shift. Patient educated. We continue to monitor.
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
## 2022-08-22 NOTE — Progress Notes (Signed)
CSW met with pt regarding SDOH: food, housing, transportation, utilities:  Food: pt reports food stamps shut off about 3 months ago, she has already been in touch with DSS and has to continue process with them to reapply. Transportation: pt reports she primarily walks, has not used bus system much.  Discussed medicaid transportation to medical appts. Pt was not aware of this option, will discuss when she returns to DSS.  Prefers face to face rather than calling on the phone to apply. Housing: pt reports no current income.  She has applied and been declined for disability.  She is considering applying again.  Pt has also worked with [employer] employment program in the past and would like to return.  She is aware of [facility].  She has been there, does not want to stay there overnight, prefers to be outside, said [facility] quite dirty.  Discussed other services such as mailing address that case manager may be able to assist with.  Pt has also preferred not to attempt admission to shelters.  Pt has been able to stay with friends, most recently with boyfriend Bobby's mother, and she will continue to pursue this for housing options.  Utilities: no active utilities due to no housing currently.   Daleen Squibb, MSW, LCSW 6/26/202411:13 AM
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
## 2022-08-22 NOTE — TOC Progression Note (Signed)
Transition of Care [facility]) - Progression Note    Patient Details  Name: Chelsea Blake MRN: [id number] Date of Birth: Jan 20, 1971  Transition of Care [facility]) CM/SW Contact  Lorri Frederick, LCSW Phone Number: 08/22/2022, 11:04 AM  Clinical Narrative:   No SNF bed offers. CSW spoke with Reyne Dumas regarding placement options.  She asked CSW to confirm if pt has any disability income--pt reports that she does not.  Tammy not able to consider pt at her SNF buildings with medicaid and no disability income.      Expected Discharge Plan: Skilled Nursing Facility Barriers to Discharge: Continued Medical Work up, SNF Pending bed offer  Expected Discharge Plan and Services In-house Referral: Clinical Social Work   Post Acute Care Choice: Skilled Nursing Facility Living arrangements for the past 2 months: Homeless                                       Social Determinants of Health (SDOH) Interventions SDOH Screenings   Food Insecurity: Food Insecurity Present (08/17/2022)  Housing: High Risk (08/17/2022)  Transportation Needs: Unmet Transportation Needs (08/17/2022)  Utilities: At Risk (08/17/2022)  Depression (PHQ2-9): Low Risk  (03/16/2022)  Tobacco Use: High Risk (08/17/2022)    Readmission Risk Interventions     No data to display
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
## 2022-08-23 LAB — BASIC METABOLIC PANEL
Anion gap: 9 (ref 5–15)
BUN: 9 mg/dL (ref 6–20)
CO2: 26 mmol/L (ref 22–32)
Calcium: 9.1 mg/dL (ref 8.9–10.3)
Chloride: 98 mmol/L (ref 98–111)
Creatinine, Ser: 0.56 mg/dL (ref 0.44–1.00)
GFR, Estimated: >60 mL/min (ref >60)
Glucose, Bld: 153 mg/dL — ABNORMAL HIGH (ref 70–99)
Potassium: 4.2 mmol/L (ref 3.5–5.1)
Sodium: 133 mmol/L — ABNORMAL LOW (ref 135–145)
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
## 2022-08-24 LAB — CREATININE, SERUM
Creatinine, Ser: 0.66 mg/dL (ref 0.44–1.00)
GFR, Estimated: >60 mL/min (ref >60)
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
## 2022-08-24 NOTE — Progress Notes (Signed)
PT Cancellation Note  Patient Details Name: Chelsea Blake MRN: [id number] DOB: [birth year]   Cancelled Treatment:    Reason Eval/Treat Not Completed: (P) Other (comment);Patient declined, no reason specified (Pt reports she has mobilized with staff and just finished bathing and wished to rest.)   Laure Leone J Aundria Rud 08/24/2022, 4:32 PM  Bonney Leitz , PTA Acute Rehabilitation Services Office [phone]

## 2022-08-25 NOTE — Plan of Care (Signed)

## 2022-08-25 NOTE — Progress Notes (Signed)
Patient requesting more pain medication. She is currently taking 10 mg oxycodone PRN. She is requesting a larger dose. I told her that it would be up to the Orthopedic Team to increase dosage.  Kathie Dike PA with Ortho rounding at bedside at this time. Notified her of patient's request. Defer final decision to medical team.
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
## 2022-08-26 NOTE — Plan of Care (Signed)
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
## 2022-08-27 ENCOUNTER — Other Ambulatory Visit: Payer: Self-pay

## 2022-08-27 DIAGNOSIS — S62292A Other fracture of first metacarpal bone, left hand, initial encounter for closed fracture: Secondary | ICD-10-CM | POA: Diagnosis present

## 2022-08-27 DIAGNOSIS — Y9301 Activity, walking, marching and hiking: Secondary | ICD-10-CM | POA: Diagnosis present

## 2022-08-27 DIAGNOSIS — Z880 Allergy status to penicillin: Secondary | ICD-10-CM | POA: Diagnosis not present

## 2022-08-27 DIAGNOSIS — F4310 Post-traumatic stress disorder, unspecified: Secondary | ICD-10-CM | POA: Diagnosis present

## 2022-08-27 DIAGNOSIS — Z885 Allergy status to narcotic agent status: Secondary | ICD-10-CM | POA: Diagnosis not present

## 2022-08-27 DIAGNOSIS — F419 Anxiety disorder, unspecified: Secondary | ICD-10-CM | POA: Diagnosis present

## 2022-08-27 DIAGNOSIS — Z803 Family history of malignant neoplasm of breast: Secondary | ICD-10-CM | POA: Diagnosis not present

## 2022-08-27 DIAGNOSIS — E876 Hypokalemia: Secondary | ICD-10-CM | POA: Diagnosis present

## 2022-08-27 DIAGNOSIS — F17210 Nicotine dependence, cigarettes, uncomplicated: Secondary | ICD-10-CM | POA: Diagnosis present

## 2022-08-27 DIAGNOSIS — S82002A Unspecified fracture of left patella, initial encounter for closed fracture: Secondary | ICD-10-CM | POA: Diagnosis present

## 2022-08-27 DIAGNOSIS — F32A Depression, unspecified: Secondary | ICD-10-CM | POA: Diagnosis present

## 2022-08-27 DIAGNOSIS — Z808 Family history of malignant neoplasm of other organs or systems: Secondary | ICD-10-CM | POA: Diagnosis not present

## 2022-08-27 DIAGNOSIS — F259 Schizoaffective disorder, unspecified: Secondary | ICD-10-CM | POA: Diagnosis present

## 2022-08-27 DIAGNOSIS — Z8261 Family history of arthritis: Secondary | ICD-10-CM | POA: Diagnosis not present

## 2022-08-27 DIAGNOSIS — H9312 Tinnitus, left ear: Secondary | ICD-10-CM | POA: Diagnosis present

## 2022-08-27 DIAGNOSIS — Z5900 Homelessness unspecified: Secondary | ICD-10-CM | POA: Diagnosis not present

## 2022-08-27 DIAGNOSIS — S82832A Other fracture of upper and lower end of left fibula, initial encounter for closed fracture: Secondary | ICD-10-CM | POA: Diagnosis present

## 2022-08-27 DIAGNOSIS — Z8249 Family history of ischemic heart disease and other diseases of the circulatory system: Secondary | ICD-10-CM | POA: Diagnosis not present

## 2022-08-27 DIAGNOSIS — S82202A Unspecified fracture of shaft of left tibia, initial encounter for closed fracture: Secondary | ICD-10-CM | POA: Diagnosis present

## 2022-08-27 DIAGNOSIS — Z634 Disappearance and death of family member: Secondary | ICD-10-CM | POA: Diagnosis not present

## 2022-08-27 DIAGNOSIS — F1011 Alcohol abuse, in remission: Secondary | ICD-10-CM | POA: Diagnosis present

## 2022-08-27 DIAGNOSIS — S82831A Other fracture of upper and lower end of right fibula, initial encounter for closed fracture: Secondary | ICD-10-CM | POA: Diagnosis present

## 2022-08-27 DIAGNOSIS — Z23 Encounter for immunization: Secondary | ICD-10-CM | POA: Diagnosis not present

## 2022-08-27 DIAGNOSIS — Y92410 Unspecified street and highway as the place of occurrence of the external cause: Secondary | ICD-10-CM | POA: Diagnosis not present

## 2022-08-27 DIAGNOSIS — F1411 Cocaine abuse, in remission: Secondary | ICD-10-CM | POA: Diagnosis present

## 2022-08-27 DIAGNOSIS — R519 Headache, unspecified: Secondary | ICD-10-CM | POA: Diagnosis present

## 2022-08-27 DIAGNOSIS — S82102A Unspecified fracture of upper end of left tibia, initial encounter for closed fracture: Secondary | ICD-10-CM | POA: Diagnosis present
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
## 2022-08-28 ENCOUNTER — Inpatient Hospital Stay (HOSPITAL_COMMUNITY): Payer: MEDICAID

## 2022-08-28 ENCOUNTER — Other Ambulatory Visit: Payer: Self-pay

## 2022-08-28 NOTE — TOC Progression Note (Signed)
Transition of Care [facility]) - Progression Note    Patient Details  Name: Chelsea Blake MRN: [id number] Date of Birth: 06-03-1970  Transition of Care [facility]) CM/SW Contact  Lorri Frederick, LCSW Phone Number: 08/28/2022, 2:32 PM  Clinical Narrative:   CSW informed pt had question about how she could get on medicare.  Spoke to pt about this, discussed that still no bed offers.   CSW reached out to [facility] leadership to discuss lack of options.      Expected Discharge Plan: Skilled Nursing Facility Barriers to Discharge: Continued Medical Work up, SNF Pending bed offer  Expected Discharge Plan and Services In-house Referral: Clinical Social Work   Post Acute Care Choice: Skilled Nursing Facility Living arrangements for the past 2 months: Homeless                                       Social Determinants of Health (SDOH) Interventions SDOH Screenings   Food Insecurity: Food Insecurity Present (08/17/2022)  Housing: High Risk (08/17/2022)  Transportation Needs: Unmet Transportation Needs (08/17/2022)  Utilities: At Risk (08/17/2022)  Depression (PHQ2-9): Low Risk  (03/16/2022)  Tobacco Use: High Risk (08/17/2022)    Readmission Risk Interventions     No data to display
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
## 2022-08-29 ENCOUNTER — Inpatient Hospital Stay (HOSPITAL_COMMUNITY): Payer: MEDICAID

## 2022-08-29 MED ORDER — METHOCARBAMOL 500 MG PO TABS: 500.0000 mg | ORAL_TABLET | Freq: Three times a day (TID) | ORAL | 0 refills | Status: DC | PRN

## 2022-08-29 MED ORDER — ASPIRIN 81 MG PO TBEC: 81.0000 mg | DELAYED_RELEASE_TABLET | Freq: Two times a day (BID) | ORAL | 0 refills | Status: DC

## 2022-08-29 MED ORDER — OXYCODONE HCL 5 MG PO TABS: 5.0000 mg | ORAL_TABLET | ORAL | 0 refills | Status: DC | PRN

## 2022-08-29 MED ORDER — NAPROXEN 500 MG PO TABS: 500.0000 mg | ORAL_TABLET | Freq: Two times a day (BID) | ORAL | 0 refills | Status: DC
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
## 2022-08-29 NOTE — Progress Notes (Signed)
Orthopedic Tech Progress Note Patient Details:  Chelsea Blake 02-01-1971 [id number]  Casting Type of Cast: Short arm cast Cast Location: LUE Cast Material: Fiberglass Cast Intervention: Application  Post Interventions Patient Tolerated: Well Instructions Provided: Care of device   [name] 08/29/2022, 8:53 AM
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
## 2022-08-29 NOTE — Plan of Care (Signed)

## 2022-08-29 NOTE — TOC Progression Note (Signed)
Transition of Care [facility]) - Progression Note    Patient Details  Name: Chelsea Blake MRN: [id number] Date of Birth: [birth year]  Transition of Care [facility]) CM/SW Contact  Lorri Frederick, LCSW Phone Number: 08/29/2022, 2:50 PM  Clinical Narrative:   Situation reviewed with [facility] supervisor Macario Golds, who requested CSW speak with pt about options to DC home with [facility] services.  CSW spoke with pt, updated that still have no bed offers.  Discussed options for DC home.  Pt is hoping to stay with her boyfriend and his cousin in [place].  Also has another cousin in [place] who may be an option.  Pt will speak to boyfriend in AM to pursue this.    Expected Discharge Plan: Skilled Nursing Facility Barriers to Discharge: Continued Medical Work up, SNF Pending bed offer  Expected Discharge Plan and Services In-house Referral: Clinical Social Work   Post Acute Care Choice: Skilled Nursing Facility Living arrangements for the past 2 months: Homeless                                       Social Determinants of Health (SDOH) Interventions SDOH Screenings   Food Insecurity: Food Insecurity Present (08/17/2022)  Housing: High Risk (08/17/2022)  Transportation Needs: Unmet Transportation Needs (08/17/2022)  Utilities: At Risk (08/17/2022)  Depression (PHQ2-9): Low Risk  (03/16/2022)  Tobacco Use: High Risk (08/17/2022)    Readmission Risk Interventions     No data to display
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
## 2022-08-30 NOTE — TOC Progression Note (Signed)
Transition of Care [facility]) - Progression Note    Patient Details  Name: Chelsea Blake MRN: [id number] Date of Birth: [birth year]  [facility]) CM/SW Contact  Lorri Frederick, LCSW Phone Number: 08/30/2022, 9:39 AM  Clinical Narrative:   CSW spoke with pt about DC home options.  Pt did speak with her boyfriend, they are renting a place that is new to them and is currently empty, no beds or other furniture.  Pt states they need "7 or 8 days to get it ready."  Pt is in agreement with working towards DC goal to home with [facility].  RNCM notified and will look for [facility] provider.  Medicaid payer.     Expected Discharge Plan: Home w Home Health Services Barriers to Discharge: No SNF bed, Inadequate or no insurance  Expected Discharge Plan and Services In-house Referral: Clinical Social Work   Post Acute Care Choice: Home Health Living arrangements for the past 2 months: Homeless                                       Social Determinants of Health (SDOH) Interventions SDOH Screenings   Food Insecurity: Food Insecurity Present (08/17/2022)  Housing: High Risk (08/17/2022)  Transportation Needs: Unmet Transportation Needs (08/17/2022)  Utilities: At Risk (08/17/2022)  Depression (PHQ2-9): Low Risk  (03/16/2022)  Tobacco Use: High Risk (08/17/2022)    Readmission Risk Interventions     No data to display

## 2022-08-30 NOTE — Plan of Care (Signed)

## 2022-08-31 LAB — CREATININE, SERUM
Creatinine, Ser: 0.59 mg/dL (ref 0.44–1.00)
GFR, Estimated: >60 mL/min (ref >60)
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
## 2022-08-31 NOTE — Plan of Care (Signed)

## 2022-09-01 MED ORDER — DIPHENHYDRAMINE HCL 12.5 MG/5ML PO ELIX
25.0000 mg | ORAL_SOLUTION | ORAL | Status: DC | PRN
  Administered 2022-09-01 – 2022-09-02 (×3): 25 mg via ORAL
  Administered 2022-09-02: 50 mg via ORAL
  Administered 2022-09-02 – 2022-09-03 (×2): 25 mg via ORAL
  Administered 2022-09-03 – 2022-09-05 (×7): 50 mg via ORAL
  Filled 2022-09-01 (×2): qty 20
  Filled 2022-09-01 (×2): qty 10
  Filled 2022-09-01 (×2): qty 20
  Filled 2022-09-01 (×2): qty 10
  Filled 2022-09-01 (×3): qty 20
  Filled 2022-09-01: qty 10
  Filled 2022-09-01: qty 20
  Filled 2022-09-01: qty 10
  Filled 2022-09-01: qty 20

## 2022-09-01 NOTE — Progress Notes (Addendum)
Patient  reporting increased pain of her right ankle and she thinks it is more swollen. Patient requested an ankle brace.Notified Orthopedics, new order received.
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
## 2022-09-01 NOTE — Progress Notes (Signed)
Orthostatic BP check ordered. Patient refused at this time. Will try to obtain later. Notified MD.

## 2022-09-01 NOTE — Progress Notes (Signed)
Patient reported to OT that she started having headaches at the back of her head and her vision was going black when she stood up. BP stable. Notified MD.

## 2022-09-01 NOTE — Plan of Care (Signed)
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
## 2022-09-02 NOTE — Progress Notes (Signed)
This nurse assisted with giving pain medicine tonight. While providing patient with her pain meds, she announced that she need to speak with the doctor regarding her discharge order.  Patient stated that she needs to stay in the hospital until Wednesday.  Her original plan involved her boyfriend's cousin's home pending landlord approval.  [place] has disapproved of her staying there and now she is trying to figure out another way.  She would like the doctor to cancel her discharge until Wednesday.  Patient's nurse has been informed of this information.

## 2022-09-02 NOTE — TOC Progression Note (Signed)
Transition of Care [facility]) - Progression Note    Patient Details  Name: Chelsea Blake MRN: [id number] Date of Birth: 10-29-1970  Transition of Care [facility]) CM/SW Contact  Ronny Bacon, RN Phone Number: 09/02/2022, 4:39 PM  Clinical Narrative:   Patient requested hospital bed to be delivered to [address] [place], [place]. Patient reports that she spoke with DME company rep about getting a hospital bed and they told her she could get one as long as it's ordered from hospital. Reached out to [employer] Liaison Jasmine to see if able to deliver hospital bed using patient insurance. Awaiting response. Message sent to attending for hospital bed order.    Expected Discharge Plan: Home w Home Health Services Barriers to Discharge: No SNF bed, Inadequate or no insurance  Expected Discharge Plan and Services In-house Referral: Clinical Social Work   Post Acute Care Choice: Home Health Living arrangements for the past 2 months: Homeless                                       Social Determinants of Health (SDOH) Interventions SDOH Screenings   Food Insecurity: Food Insecurity Present (08/17/2022)  Housing: High Risk (08/17/2022)  Transportation Needs: Unmet Transportation Needs (08/17/2022)  Utilities: At Risk (08/17/2022)  Depression (PHQ2-9): Low Risk  (03/16/2022)  Tobacco Use: High Risk (08/17/2022)    Readmission Risk Interventions     No data to display

## 2022-09-02 NOTE — Plan of Care (Addendum)
  FOR DME NEEDS: Patient needing order for hospital bed Patient new address: [address]. [facility] [employer] fax for bed: [phone] Email [email or web address]  Problem: Education: Goal: Knowledge of General Education information will improve Description: Including pain rating scale, medication(s)/side effects and non-pharmacologic comfort measures Outcome: Progressing   Problem: Activity: Goal: Risk for activity intolerance will decrease Outcome: Progressing   Problem: Pain Managment: Goal: General experience of comfort will improve Outcome: Progressing   Problem: Safety: Goal: Ability to remain free from injury will improve Outcome: Progressing   Problem: Skin Integrity: Goal: Risk for impaired skin integrity will decrease Outcome: Progressing

## 2022-09-03 ENCOUNTER — Telehealth: Payer: Self-pay | Admitting: Family

## 2022-09-03 ENCOUNTER — Other Ambulatory Visit (HOSPITAL_COMMUNITY): Payer: Self-pay

## 2022-09-03 MED ORDER — NAPROXEN 500 MG PO TABS
500.0000 mg | ORAL_TABLET | Freq: Two times a day (BID) | ORAL | 0 refills | Status: AC
  Filled 2022-09-03: qty 60, 30d supply, fill #0

## 2022-09-03 MED ORDER — OXYCODONE HCL 5 MG PO TABS
5.0000 mg | ORAL_TABLET | ORAL | 0 refills | Status: AC | PRN
  Filled 2022-09-03: qty 28, 5d supply, fill #0

## 2022-09-03 MED ORDER — METHOCARBAMOL 500 MG PO TABS
500.0000 mg | ORAL_TABLET | Freq: Three times a day (TID) | ORAL | 0 refills | Status: AC | PRN
  Filled 2022-09-03: qty 30, 10d supply, fill #0

## 2022-09-03 MED ORDER — ONDANSETRON HCL 4 MG PO TABS
4.0000 mg | ORAL_TABLET | Freq: Three times a day (TID) | ORAL | 0 refills | Status: AC | PRN
  Filled 2022-09-03: qty 14, 5d supply, fill #0

## 2022-09-03 MED ORDER — ASPIRIN 81 MG PO TBEC
81.0000 mg | DELAYED_RELEASE_TABLET | Freq: Two times a day (BID) | ORAL | 0 refills | Status: AC
  Filled 2022-09-03: qty 56, 28d supply, fill #0

## 2022-09-03 MED ORDER — ACETAMINOPHEN 500 MG PO TABS
1000.0000 mg | ORAL_TABLET | Freq: Three times a day (TID) | ORAL | 0 refills | Status: AC | PRN
  Filled 2022-09-03: qty 100, 17d supply, fill #0

## 2022-09-03 NOTE — Telephone Encounter (Signed)
Copied from CRM [id number]. Topic: Appointment Scheduling - Scheduling Inquiry for Clinic >> Sep 03, 2022 10:21 AM Macon Large wrote: Reason for CRM: Marylene Land with [facility] requests hospital fu appt but there were no appts within the next 2 weeks. Please contact pt to schedule hospital fu appt.

## 2022-09-03 NOTE — Progress Notes (Signed)
Patient asking for ace bandage to be removed from legs because she has an allergic reaction to the colored stitching on the edge of the bandage. Ace wrap removed from right leg prior to nurse going into room, kerlix dressing remains in place. After removing ace bandage from left extremity, kerlix dressing remained in place, and leg left resting on knee immobilizer while supplies were prepared, explained to patient there were no ace wrap bandages on the unit without the stitching on the edge, and there was no bandage that she was requesting.  Informed patient that I had to put the knee immobilizer back on until we decided on something different for her, patient refused to have knee immobilizer placed over kerlix dressing. Requested to speak to a supervisor; charge nurse made aware.
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
## 2022-09-03 NOTE — Telephone Encounter (Signed)
Called to schedule patient hos follow up , no answer called two time 09/03/2022 11:15am couldn't leave message
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
## 2022-09-04 NOTE — Progress Notes (Signed)
OT Cancellation Note  Patient Details Name: Chelsea Blake MRN: [id number] DOB: [birth year]   Cancelled Treatment:    Reason Eval/Treat Not Completed: (P) Patient declined, no reason specified, Pt tired, asked to check back later.   Alexis Goodell 09/04/2022, 1:19 PM
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
## 2022-09-04 NOTE — Progress Notes (Signed)
OT Cancellation Note  Patient Details Name: Chelsea Blake MRN: [id number] DOB: Mar 07, 1970   Cancelled Treatment:    Reason Eval/Treat Not Completed: (P) Patient declined, no reason specified, Pt declined, stated she was still sleeping and feeling groggy from meds. Pt asked to come back in morning and needed help with bathing, attempted to complete today but Pt declined.  Alexis Goodell 09/04/2022, 3:34 PM
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
## 2022-09-05 NOTE — Plan of Care (Signed)

## 2022-09-05 NOTE — Care Plan (Signed)
Patient discharged home (hotel), to self/ family care. Patient in stable condition upon discharge. All discharge instructions including medication regimen and follow-up appointments explained to patient. Discharge medications given to patient at bedside. Patient left via w/c down to boyfriend's car. All personal belongings taken with patient.

## 2022-09-06 ENCOUNTER — Telehealth: Payer: Self-pay

## 2022-09-06 NOTE — Transitions of Care (Post Inpatient/ED Visit) (Signed)
   09/06/2022  Name: Chelsea Blake MRN: [id number] DOB: 09/18/1970  Today's TOC FU Call Status: Today's TOC FU Call Status:: Unsuccessul Call (1st Attempt) Unsuccessful Call (1st Attempt) Date: 09/06/22  Attempted to reach the patient regarding the most recent Inpatient/ED visit.  Follow Up Plan: Additional outreach attempts will be made to reach the patient to complete the Transitions of Care (Post Inpatient/ED visit) call.   Signature  Robyne Peers, RN
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
## 2022-09-10 ENCOUNTER — Telehealth: Payer: Self-pay

## 2022-09-10 NOTE — Transitions of Care (Post Inpatient/ED Visit) (Signed)
   09/10/2022  Name: Chelsea Blake MRN: [id number] DOB: Sep 08, 1970  Today's TOC FU Call Status: Today's TOC FU Call Status:: Unsuccessful Call (2nd Attempt) Unsuccessful Call (1st Attempt) Date: 09/06/22 Unsuccessful Call (2nd Attempt) Date: 09/10/22  Attempted to reach the patient regarding the most recent Inpatient/ED visit.  Follow Up Plan: Additional outreach attempts will be made to reach the patient to complete the Transitions of Care (Post Inpatient/ED visit) call.   Signature  Robyne Peers, RN

## 2022-09-11 ENCOUNTER — Telehealth: Payer: Self-pay

## 2022-09-11 NOTE — Transitions of Care (Post Inpatient/ED Visit) (Signed)
   09/11/2022  Name: Chelsea Blake MRN: [id number] DOB: 1970-10-11  Today's TOC FU Call Status: Today's TOC FU Call Status:: Unsuccessful Call (3rd Attempt) Unsuccessful Call (1st Attempt) Date: 09/06/22 Unsuccessful Call (2nd Attempt) Date: 09/10/22 Unsuccessful Call (3rd Attempt) Date: 09/11/22  Attempted to reach the patient regarding the most recent Inpatient/ED visit.  Follow Up Plan: No further outreach attempts will be made at this time. We have been unable to contact the patient.  Letter sent to patient requesting she contact [facility] to schedule a follow up appointment as we have not been able to reach her.   Signature  Robyne Peers, RN

## 2022-09-11 NOTE — Telephone Encounter (Signed)
Call returned to patient's daughter, Wyatt Mage.  She had questions about her mother's follow up appointments.   Informed her that I do not have authorization to speak with her.  She said she understood and will be going to help her mother soon. I instructed her to call me back when she is with her mother and I can get approval to speak with her

## 2022-09-26 ENCOUNTER — Encounter: Payer: Self-pay | Admitting: Family

## 2022-09-26 ENCOUNTER — Ambulatory Visit (INDEPENDENT_AMBULATORY_CARE_PROVIDER_SITE_OTHER): Payer: MEDICAID | Admitting: Family

## 2022-09-26 VITALS — BP 107/73 | HR 90 | Temp 97.7°F | Ht 62.0 in | Wt 140.0 lb

## 2022-09-26 DIAGNOSIS — J449 Chronic obstructive pulmonary disease, unspecified: Secondary | ICD-10-CM | POA: Diagnosis not present

## 2022-09-26 DIAGNOSIS — Z13228 Encounter for screening for other metabolic disorders: Secondary | ICD-10-CM | POA: Diagnosis not present

## 2022-09-26 MED ORDER — ALBUTEROL SULFATE HFA 108 (90 BASE) MCG/ACT IN AERS
2.0000 | INHALATION_SPRAY | RESPIRATORY_TRACT | 2 refills | Status: DC | PRN
Start: 2022-09-26 — End: 2023-05-29
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
## 2022-09-26 NOTE — Progress Notes (Signed)
Pt needs potassium checked. And will reschedule Pap for next month.   Pt asking for albuterol inhaler to be sent in.

## 2022-10-31 ENCOUNTER — Ambulatory Visit: Payer: MEDICAID | Admitting: Physical Therapy

## 2022-11-05 ENCOUNTER — Encounter: Payer: MEDICAID | Admitting: Family

## 2022-11-05 NOTE — Progress Notes (Signed)
Erroneous encounter-disregard

## 2022-11-06 ENCOUNTER — Ambulatory Visit: Payer: MEDICAID | Attending: Orthopedic Surgery | Admitting: Physical Therapy

## 2022-11-06 DIAGNOSIS — M25572 Pain in left ankle and joints of left foot: Secondary | ICD-10-CM | POA: Insufficient documentation

## 2022-11-06 DIAGNOSIS — R262 Difficulty in walking, not elsewhere classified: Secondary | ICD-10-CM | POA: Insufficient documentation

## 2022-11-06 DIAGNOSIS — M6281 Muscle weakness (generalized): Secondary | ICD-10-CM | POA: Insufficient documentation

## 2022-11-06 DIAGNOSIS — R2689 Other abnormalities of gait and mobility: Secondary | ICD-10-CM | POA: Insufficient documentation
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
## 2022-11-20 ENCOUNTER — Ambulatory Visit: Payer: MEDICAID

## 2022-11-20 DIAGNOSIS — R262 Difficulty in walking, not elsewhere classified: Secondary | ICD-10-CM | POA: Diagnosis present

## 2022-11-20 DIAGNOSIS — R2689 Other abnormalities of gait and mobility: Secondary | ICD-10-CM | POA: Diagnosis present

## 2022-11-20 DIAGNOSIS — M6281 Muscle weakness (generalized): Secondary | ICD-10-CM | POA: Diagnosis present

## 2022-11-20 DIAGNOSIS — M25572 Pain in left ankle and joints of left foot: Secondary | ICD-10-CM | POA: Diagnosis present

## 2022-11-27 ENCOUNTER — Ambulatory Visit: Payer: MEDICAID | Admitting: Family

## 2022-11-29 ENCOUNTER — Ambulatory Visit: Payer: MEDICAID

## 2022-12-04 ENCOUNTER — Ambulatory Visit: Payer: MEDICAID | Attending: Orthopedic Surgery

## 2022-12-04 DIAGNOSIS — R262 Difficulty in walking, not elsewhere classified: Secondary | ICD-10-CM | POA: Diagnosis present

## 2022-12-04 DIAGNOSIS — M6281 Muscle weakness (generalized): Secondary | ICD-10-CM | POA: Insufficient documentation

## 2022-12-04 DIAGNOSIS — M25572 Pain in left ankle and joints of left foot: Secondary | ICD-10-CM | POA: Diagnosis present

## 2022-12-04 DIAGNOSIS — R2689 Other abnormalities of gait and mobility: Secondary | ICD-10-CM | POA: Insufficient documentation
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
## 2022-12-05 ENCOUNTER — Encounter: Payer: MEDICAID | Admitting: Family

## 2022-12-05 NOTE — Progress Notes (Signed)
Erroneous encounter-disregard

## 2022-12-06 ENCOUNTER — Ambulatory Visit: Payer: MEDICAID | Admitting: Physical Therapy

## 2022-12-06 ENCOUNTER — Encounter: Payer: Self-pay | Admitting: Physical Therapy

## 2022-12-06 DIAGNOSIS — R2689 Other abnormalities of gait and mobility: Secondary | ICD-10-CM

## 2022-12-06 DIAGNOSIS — M6281 Muscle weakness (generalized): Secondary | ICD-10-CM

## 2022-12-06 DIAGNOSIS — M25572 Pain in left ankle and joints of left foot: Secondary | ICD-10-CM

## 2022-12-06 DIAGNOSIS — R262 Difficulty in walking, not elsewhere classified: Secondary | ICD-10-CM
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
## 2022-12-11 ENCOUNTER — Ambulatory Visit: Payer: MEDICAID

## 2022-12-13 ENCOUNTER — Ambulatory Visit: Payer: MEDICAID | Admitting: Physical Therapy

## 2022-12-18 ENCOUNTER — Ambulatory Visit: Payer: MEDICAID

## 2022-12-20 ENCOUNTER — Ambulatory Visit: Payer: MEDICAID

## 2022-12-21 ENCOUNTER — Telehealth (HOSPITAL_COMMUNITY): Payer: Self-pay

## 2022-12-21 NOTE — Telephone Encounter (Signed)
Fax received from the pharmacy requesting a refill of Quetiapine. Patient has not been seen since January. I called the patient and let her know she would need to schedule an appointment before we can give anymore medication. Patient voiced her understanding

## 2022-12-25 ENCOUNTER — Ambulatory Visit: Payer: MEDICAID

## 2022-12-27 ENCOUNTER — Ambulatory Visit: Payer: MEDICAID

## 2023-01-01 ENCOUNTER — Ambulatory Visit: Payer: MEDICAID

## 2023-01-03 ENCOUNTER — Ambulatory Visit: Payer: MEDICAID

## 2023-02-14 ENCOUNTER — Ambulatory Visit (HOSPITAL_COMMUNITY): Payer: Self-pay | Admitting: Mental Health

## 2023-02-22 ENCOUNTER — Encounter (HOSPITAL_COMMUNITY): Payer: Self-pay | Admitting: Psychiatry

## 2023-02-22 ENCOUNTER — Telehealth (INDEPENDENT_AMBULATORY_CARE_PROVIDER_SITE_OTHER): Payer: MEDICAID | Admitting: Psychiatry

## 2023-02-22 DIAGNOSIS — F411 Generalized anxiety disorder: Secondary | ICD-10-CM

## 2023-02-22 DIAGNOSIS — F250 Schizoaffective disorder, bipolar type: Secondary | ICD-10-CM | POA: Diagnosis not present

## 2023-02-22 MED ORDER — TRAZODONE HCL 50 MG PO TABS
50.0000 mg | ORAL_TABLET | Freq: Every day | ORAL | 3 refills | Status: AC
Start: 2023-02-22 — End: ?

## 2023-02-22 MED ORDER — GABAPENTIN 100 MG PO CAPS
100.0000 mg | ORAL_CAPSULE | Freq: Three times a day (TID) | ORAL | 3 refills | Status: AC
Start: 2023-02-22 — End: ?

## 2023-02-22 MED ORDER — FLUOXETINE HCL 20 MG PO CAPS
20.0000 mg | ORAL_CAPSULE | Freq: Every day | ORAL | 3 refills | Status: AC
Start: 2023-02-22 — End: ?

## 2023-02-22 MED ORDER — QUETIAPINE FUMARATE 50 MG PO TABS
50.0000 mg | ORAL_TABLET | Freq: Every day | ORAL | 3 refills | Status: AC
Start: 2023-02-22 — End: ?
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
## 2023-02-26 ENCOUNTER — Ambulatory Visit (HOSPITAL_COMMUNITY): Payer: MEDICAID | Admitting: Mental Health

## 2023-05-02 ENCOUNTER — Encounter (HOSPITAL_COMMUNITY): Payer: MEDICAID | Admitting: Psychiatry

## 2023-05-29 ENCOUNTER — Other Ambulatory Visit: Payer: Self-pay

## 2023-05-29 ENCOUNTER — Encounter (HOSPITAL_COMMUNITY): Payer: Self-pay

## 2023-05-29 ENCOUNTER — Emergency Department (HOSPITAL_COMMUNITY)
Admission: EM | Admit: 2023-05-29 | Discharge: 2023-05-29 | Disposition: A | Discharge status: Home / Self Care | Payer: MEDICAID | Attending: Emergency Medicine | Admitting: Emergency Medicine

## 2023-05-29 ENCOUNTER — Emergency Department (HOSPITAL_COMMUNITY): Payer: MEDICAID

## 2023-05-29 DIAGNOSIS — R059 Cough, unspecified: Secondary | ICD-10-CM | POA: Diagnosis present

## 2023-05-29 DIAGNOSIS — J449 Chronic obstructive pulmonary disease, unspecified: Secondary | ICD-10-CM | POA: Insufficient documentation

## 2023-05-29 DIAGNOSIS — J40 Bronchitis, not specified as acute or chronic: Secondary | ICD-10-CM | POA: Insufficient documentation

## 2023-05-29 LAB — RESP PANEL BY RT-PCR (RSV, FLU A&B, COVID)  RVPGX2
Influenza A by PCR: NEGATIVE
Influenza B by PCR: NEGATIVE
Resp Syncytial Virus by PCR: NEGATIVE
SARS Coronavirus 2 by RT PCR: NEGATIVE

## 2023-05-29 LAB — CBC
HCT: 44.5 % (ref 36.0–46.0)
Hemoglobin: 14.2 g/dL (ref 12.0–15.0)
MCH: 29.8 pg (ref 26.0–34.0)
MCHC: 31.9 g/dL (ref 30.0–36.0)
MCV: 93.3 fL (ref 80.0–100.0)
Platelets: 334 10*3/uL (ref 150–400)
RBC: 4.77 MIL/uL (ref 3.87–5.11)
RDW: 13.8 % (ref 11.5–15.5)
WBC: 14.3 10*3/uL — ABNORMAL HIGH (ref 4.0–10.5)
nRBC: 0 % (ref 0.0–0.2)

## 2023-05-29 LAB — BASIC METABOLIC PANEL WITH GFR
Anion gap: 11 (ref 5–15)
BUN: 12 mg/dL (ref 6–20)
CO2: 23 mmol/L (ref 22–32)
Calcium: 9.2 mg/dL (ref 8.9–10.3)
Chloride: 105 mmol/L (ref 98–111)
Creatinine, Ser: 0.74 mg/dL (ref 0.44–1.00)
GFR, Estimated: >60 mL/min (ref >60)
Glucose, Bld: 169 mg/dL — ABNORMAL HIGH (ref 70–99)
Potassium: 4.1 mmol/L (ref 3.5–5.1)
Sodium: 139 mmol/L (ref 135–145)

## 2023-05-29 LAB — TROPONIN I (HIGH SENSITIVITY)
Troponin I (High Sensitivity): <2 ng/L (ref <18)
Troponin I (High Sensitivity): 3 ng/L (ref <18)

## 2023-05-29 MED ORDER — METHYLPREDNISOLONE 4 MG PO TBPK: ORAL_TABLET | ORAL | 0 refills | Status: AC

## 2023-05-29 MED ORDER — ALBUTEROL SULFATE HFA 108 (90 BASE) MCG/ACT IN AERS
2.0000 | INHALATION_SPRAY | RESPIRATORY_TRACT | 2 refills | Status: AC | PRN
Start: 2023-05-29 — End: ?

## 2023-05-29 MED ORDER — DOXYCYCLINE HYCLATE 100 MG PO CAPS: 100.0000 mg | ORAL_CAPSULE | Freq: Two times a day (BID) | ORAL | 0 refills | Status: AC

## 2023-05-29 NOTE — ED Provider Notes (Signed)
 [place] EMERGENCY DEPARTMENT AT [facility] Provider Note   CSN: [id number] Arrival date & time: 05/29/23  1236     History  Chief Complaint  Patient presents with   Chest Pain   Cough    Chelsea Blake is a 53 y.o. female.  HPI   53 year old female presents emergency department over 7 days of intermittently productive cough.  History of COPD, has an inhaler but has not been using as prescribed.  She complains of chills, productive cough, chest heaviness, decreased appetite.  No fever or chills.  Denies any nausea/vomiting/diarrhea.  This showed multiple sick people at work and she is also sensitive to allergies during this pollen season.  Home Medications Prior to Admission medications   Medication Sig Start Date End Date Taking? Authorizing Provider  doxycycline (VIBRAMYCIN) 100 MG capsule Take 1 capsule (100 mg total) by mouth 2 (two) times daily. 05/29/23  Yes Poet Hineman, Clabe Seal, DO  methylPREDNISolone (MEDROL DOSEPAK) 4 MG TBPK tablet Take as directed 05/29/23  Yes Jayanna Kroeger M, DO  albuterol (VENTOLIN HFA) 108 (90 Base) MCG/ACT inhaler Inhale 2 puffs into the lungs every 4 (four) hours as needed for wheezing or shortness of breath. 05/29/23   Jacqulene Huntley, Clabe Seal, DO  FLUoxetine (PROZAC) 20 MG capsule Take 1 capsule (20 mg total) by mouth daily. 02/22/23   Shanna Cisco, NP  gabapentin (NEURONTIN) 100 MG capsule Take 1 capsule (100 mg total) by mouth 3 (three) times daily. 02/22/23   Shanna Cisco, NP  oxycodone (OXY-IR) 5 MG capsule Take 5 mg by mouth every 4 (four) hours as needed for pain.    [provider]  QUEtiapine (SEROQUEL) 50 MG tablet Take 1 tablet (50 mg total) by mouth at bedtime. 02/22/23   Shanna Cisco, NP  traZODone (DESYREL) 50 MG tablet Take 1 tablet (50 mg total) by mouth at bedtime. 02/22/23   Shanna Cisco, NP      Allergies    Codeine, Nylon, and Penicillins    Review of Systems   Review of Systems   Constitutional:  Positive for chills and fatigue. Negative for fever.  Respiratory:  Positive for cough and chest tightness. Negative for shortness of breath.   Cardiovascular:  Negative for chest pain, palpitations and leg swelling.  Gastrointestinal:  Negative for abdominal pain, diarrhea and vomiting.  Skin:  Negative for rash.  Neurological:  Negative for headaches.    Physical Exam Updated Vital Signs BP 138/70   Pulse 76   Temp 98.2 F (36.8 C) (Oral)   Resp 16   LMP  (LMP Unknown)   SpO2 97%  Physical Exam Vitals and nursing note reviewed.  Constitutional:      General: She is not in acute distress.    Appearance: Normal appearance.  HENT:     Head: Normocephalic.     Mouth/Throat:     Mouth: Mucous membranes are moist.  Cardiovascular:     Rate and Rhythm: Normal rate.  Pulmonary:     Effort: Pulmonary effort is normal. No respiratory distress.     Breath sounds: Examination of the right-lower field reveals decreased breath sounds. Examination of the left-lower field reveals decreased breath sounds. Decreased breath sounds present. No wheezing or rales.  Abdominal:     Palpations: Abdomen is soft.     Tenderness: There is no abdominal tenderness.  Musculoskeletal:     Right lower leg: No edema.     Left lower leg: No edema.  Skin:    General: Skin is warm.  Neurological:     Mental Status: She is alert and oriented to person, place, and time. Mental status is at baseline.  Psychiatric:        Mood and Affect: Mood normal.     ED Results / Procedures / Treatments   Labs (all labs ordered are listed, but only abnormal results are displayed) Labs Reviewed  BASIC METABOLIC PANEL WITH GFR - Abnormal; Notable for the following components:      Result Value   Glucose, Bld 169 (*)    All other components within normal limits  CBC - Abnormal; Notable for the following components:   WBC 14.3 (*)    All other components within normal limits  RESP PANEL BY  RT-PCR (RSV, FLU A&B, COVID)  [id number]  TROPONIN I (HIGH SENSITIVITY)  TROPONIN I (HIGH SENSITIVITY)    EKG EKG Interpretation Date/Time:  Wednesday May 29 2023 12:51:12 EDT Ventricular Rate:  97 PR Interval:  120 QRS Duration:  74 QT Interval:  350 QTC Calculation: 444 R Axis:   69  Text Interpretation: Normal sinus rhythm Nonspecific ST abnormality Abnormal ECG When compared with ECG of 18-Aug-2022 16:46, PREVIOUS ECG IS PRESENT Confirmed by Coralee Pesa [phone]) on 05/29/2023 5:05:21 PM  Radiology DG Chest 2 View Result Date: 05/29/2023 CLINICAL DATA:  Chest pain, shortness of breath. EXAM: CHEST - 2 VIEW COMPARISON:  August 16, 2022. FINDINGS: The heart size and mediastinal contours are within normal limits. Both lungs are clear. The visualized skeletal structures are unremarkable. IMPRESSION: No active cardiopulmonary disease. Electronically Signed   By: Lupita Raider M.D.   On: 05/29/2023 14:52    Procedures Procedures    Medications Ordered in ED Medications - No data to display  ED Course/ Medical Decision Making/ A&P                                 Medical Decision Making Amount and/or Complexity of Data Reviewed Labs: ordered. Radiology: ordered.  Risk Prescription drug management.   53 year old female presents emergency department with about 7 days of intermittently productive cough, chest tightness, decreased appetite, fatigue and chills.  Patient was tachycardic in triage but this is resolved on my initial evaluation without any intervention.  Blood pressure is stable, oxygen level is normal.  She is in no respiratory distress but is frequently coughing with a coarse, wheezy cough.  Lung sounds are clear and reassuring.  EKG shows no ischemic changes.  Troponins are negative.  Blood work is reassuring, respiratory panel is negative.  Given history and duration of symptoms I believe the patient is suffering from bronchitis.  Will treat her as such.  Very low  suspicion for PE or other acute pathology at this time.  Patient at this time appears safe and stable for discharge and close outpatient follow up. Discharge plan and strict return to ED precautions discussed, patient verbalizes understanding and agreement.        Final Clinical Impression(s) / ED Diagnoses Final diagnoses:  Bronchitis    Rx / DC Orders ED Discharge Orders          Ordered    methylPREDNISolone (MEDROL DOSEPAK) 4 MG TBPK tablet        05/29/23 1730    doxycycline (VIBRAMYCIN) 100 MG capsule  2 times daily        05/29/23 1730    albuterol (VENTOLIN  HFA) 108 (90 Base) MCG/ACT inhaler  Every 4 hours PRN        05/29/23 1730              Rozelle Logan, DO 05/29/23 1754

## 2023-05-29 NOTE — ED Triage Notes (Signed)
 Pt c.o 1 week of productive cough, chest pain and SOB. No fever

## 2023-05-29 NOTE — Discharge Instructions (Signed)
 You have been seen and discharged from the emergency department.  I believe you are suffering from a bronchitis.  Use your inhaler as directed daily.  Take steroid Dosepak and antibiotic as directed.  You may continue taking Mucinex and over-the-counter medications for symptom relief.  Use hot compresses on the right eye to aid with draining.  Follow-up with your primary provider for further evaluation and further care. Take home medications as prescribed. If you have any worsening symptoms or further concerns for your health please return to an emergency department for further evaluation.

## 2023-05-29 NOTE — ED Notes (Addendum)
 Pt self PO challenged with her own mountain dew and has kept it down, no symptom complaints

## 2024-03-12 ENCOUNTER — Telehealth: Payer: Self-pay | Admitting: Family

## 2024-03-12 ENCOUNTER — Other Ambulatory Visit: Payer: Self-pay

## 2024-03-12 NOTE — Telephone Encounter (Signed)
 I called patient to reschedule TB test for 1/15. We do not have any in stock at this time. Need to reschedule for another day.

## 2024-03-13 NOTE — Telephone Encounter (Signed)
 Called pt to schedule appt and could not reach. Left vm to call office back to schedule

## 2024-03-13 NOTE — Telephone Encounter (Signed)
 Copied from CRM [id number]. Topic: Appointments - Appointment Scheduling >> Mar 13, 2024 10:38 AM Myrick T wrote: Patient/patient representative is calling to schedule an appointment. Refer to attachments for appointment information. Please call patient to set up appt for TB test as she thinks she has been exposed

## 2024-03-18 ENCOUNTER — Ambulatory Visit: Payer: Self-pay

## 2024-03-20 ENCOUNTER — Ambulatory Visit: Payer: MEDICAID
